# Patient Record
Sex: Male | Born: 1980 | Race: White | Hispanic: No | Marital: Single | Smoking: Current every day smoker
Health system: Southern US, Community
[De-identification: ages and names within clinical notes are randomized; demographics above are authoritative.]

## PROBLEM LIST (undated history)

## (undated) DIAGNOSIS — I10 Essential (primary) hypertension: Secondary | ICD-10-CM

## (undated) DIAGNOSIS — M109 Gout, unspecified: Secondary | ICD-10-CM

## (undated) DIAGNOSIS — K219 Gastro-esophageal reflux disease without esophagitis: Secondary | ICD-10-CM

## (undated) DIAGNOSIS — I214 Non-ST elevation (NSTEMI) myocardial infarction: Secondary | ICD-10-CM

## (undated) DIAGNOSIS — Z720 Tobacco use: Secondary | ICD-10-CM

## (undated) DIAGNOSIS — E785 Hyperlipidemia, unspecified: Secondary | ICD-10-CM

## (undated) HISTORY — DX: Gout, unspecified: M10.9

## (undated) HISTORY — DX: Non-ST elevation (NSTEMI) myocardial infarction: I21.4

---

## 2012-10-21 ENCOUNTER — Observation Stay (HOSPITAL_COMMUNITY): Payer: Medicaid Other

## 2012-10-21 ENCOUNTER — Inpatient Hospital Stay (HOSPITAL_COMMUNITY)
Admission: AD | Admit: 2012-10-21 | Discharge: 2012-10-23 | DRG: 282 | Disposition: A | Discharge status: Home / Self Care | Payer: Medicaid Other | Source: Other Acute Inpatient Hospital | Attending: Cardiology | Admitting: Cardiology

## 2012-10-21 ENCOUNTER — Encounter (HOSPITAL_COMMUNITY): Payer: Self-pay | Admitting: *Deleted

## 2012-10-21 DIAGNOSIS — R110 Nausea: Secondary | ICD-10-CM

## 2012-10-21 DIAGNOSIS — K219 Gastro-esophageal reflux disease without esophagitis: Secondary | ICD-10-CM

## 2012-10-21 DIAGNOSIS — Z720 Tobacco use: Secondary | ICD-10-CM

## 2012-10-21 DIAGNOSIS — R5381 Other malaise: Secondary | ICD-10-CM

## 2012-10-21 DIAGNOSIS — F17200 Nicotine dependence, unspecified, uncomplicated: Secondary | ICD-10-CM | POA: Diagnosis present

## 2012-10-21 DIAGNOSIS — R7989 Other specified abnormal findings of blood chemistry: Secondary | ICD-10-CM

## 2012-10-21 DIAGNOSIS — R51 Headache: Secondary | ICD-10-CM

## 2012-10-21 DIAGNOSIS — R079 Chest pain, unspecified: Secondary | ICD-10-CM

## 2012-10-21 DIAGNOSIS — I214 Non-ST elevation (NSTEMI) myocardial infarction: Secondary | ICD-10-CM

## 2012-10-21 DIAGNOSIS — R519 Headache, unspecified: Secondary | ICD-10-CM

## 2012-10-21 DIAGNOSIS — E7800 Pure hypercholesterolemia, unspecified: Secondary | ICD-10-CM | POA: Diagnosis present

## 2012-10-21 DIAGNOSIS — E785 Hyperlipidemia, unspecified: Secondary | ICD-10-CM

## 2012-10-21 HISTORY — DX: Tobacco use: Z72.0

## 2012-10-21 HISTORY — DX: Hyperlipidemia, unspecified: E78.5

## 2012-10-21 HISTORY — DX: Gastro-esophageal reflux disease without esophagitis: K21.9

## 2012-10-21 LAB — PRO B NATRIURETIC PEPTIDE: Pro B Natriuretic peptide (BNP): 220 pg/mL — ABNORMAL HIGH (ref 0–125)

## 2012-10-21 MED ORDER — PNEUMOCOCCAL VAC POLYVALENT 25 MCG/0.5ML IJ INJ
0.5000 mL | INJECTION | INTRAMUSCULAR | Status: AC
  Filled 2012-10-21: qty 0.5

## 2012-10-21 MED ORDER — ASPIRIN 81 MG PO CHEW
324.0000 mg | CHEWABLE_TABLET | ORAL | Status: DC
  Filled 2012-10-21: qty 4

## 2012-10-21 MED ORDER — SODIUM CHLORIDE 0.9 % IV SOLN: 250.0000 mL | INTRAVENOUS | Status: DC | PRN

## 2012-10-21 MED ORDER — HYDROCODONE-ACETAMINOPHEN 5-325 MG PO TABS
1.0000 | ORAL_TABLET | ORAL | Status: DC | PRN
  Administered 2012-10-21 – 2012-10-22 (×3): 1 via ORAL
  Filled 2012-10-21 (×3): qty 1

## 2012-10-21 MED ORDER — ASPIRIN 300 MG RE SUPP: 300.0000 mg | RECTAL | Status: DC

## 2012-10-21 MED ORDER — ASPIRIN EC 81 MG PO TBEC
81.0000 mg | DELAYED_RELEASE_TABLET | Freq: Every day | ORAL | Status: DC
  Administered 2012-10-23: 11:00:00 81 mg via ORAL
  Filled 2012-10-21 (×2): qty 1

## 2012-10-21 MED ORDER — NITROGLYCERIN 0.4 MG SL SUBL: 0.4000 mg | SUBLINGUAL_TABLET | SUBLINGUAL | Status: DC | PRN

## 2012-10-21 MED ORDER — METOPROLOL TARTRATE 12.5 MG HALF TABLET
12.5000 mg | ORAL_TABLET | Freq: Two times a day (BID) | ORAL | Status: DC
  Filled 2012-10-21: qty 1

## 2012-10-21 MED ORDER — ONDANSETRON HCL 4 MG/2ML IJ SOLN: 4.0000 mg | Freq: Four times a day (QID) | INTRAMUSCULAR | Status: DC | PRN

## 2012-10-21 MED ORDER — ACETAMINOPHEN 325 MG PO TABS: 650.0000 mg | ORAL_TABLET | ORAL | Status: DC | PRN

## 2012-10-21 MED ORDER — SODIUM CHLORIDE 0.9 % IJ SOLN: 3.0000 mL | INTRAMUSCULAR | Status: DC | PRN

## 2012-10-21 MED ORDER — ENOXAPARIN SODIUM 150 MG/ML ~~LOC~~ SOLN
1.0000 mg/kg | Freq: Two times a day (BID) | SUBCUTANEOUS | Status: DC
  Administered 2012-10-21 – 2012-10-22 (×2): 125 mg via SUBCUTANEOUS
  Filled 2012-10-21 (×4): qty 1

## 2012-10-21 MED ORDER — SODIUM CHLORIDE 0.9 % IJ SOLN
3.0000 mL | Freq: Two times a day (BID) | INTRAMUSCULAR | Status: DC
  Administered 2012-10-21: 3 mL via INTRAVENOUS

## 2012-10-21 MED ORDER — INFLUENZA VIRUS VACC SPLIT PF IM SUSP
0.5000 mL | INTRAMUSCULAR | Status: AC
  Filled 2012-10-21: qty 0.5

## 2012-10-21 MED ORDER — PANTOPRAZOLE SODIUM 40 MG PO TBEC
40.0000 mg | DELAYED_RELEASE_TABLET | Freq: Every day | ORAL | Status: DC
  Administered 2012-10-21 – 2012-10-23 (×3): 40 mg via ORAL
  Filled 2012-10-21 (×3): qty 1

## 2012-10-21 NOTE — Progress Notes (Signed)
ANTICOAGULATION CONSULT NOTE - Initial Consult  Pharmacy Consult for Lovenox Indication: chest pain/ACS  Allergies  Allergen Reactions  . Sulfur     Patient Measurements: Height: 6' (182.9 cm) Weight: 270 lb 4.5 oz (122.6 kg) IBW/kg (Calculated) : 77.6   Vital Signs: Temp: 98.6 F (37 C) (12/15 1659) Temp src: Oral (12/15 1659) BP: 145/84 mmHg (12/15 1629) Pulse Rate: 78  (12/15 1659)  Labs: No results found for this basename: HGB:2,HCT:3,PLT:3,APTT:3,LABPROT:3,INR:3,HEPARINUNFRC:3,CREATININE:3,CKTOTAL:3,CKMB:3,TROPONINI:3 in the last 72 hours  CrCl is unknown because no creatinine reading has been taken.   Medical History: Past Medical History  Diagnosis Date  . GERD (gastroesophageal reflux disease)    Assessment: Brian Deleon is a 31 year old male transferred from [facility] for cp/acs and positive troponin to begin lovenox per pharmacy. Noted patient's labs draw at [facility] show hgb of 16.2 and platelets of 201. Renal function ok with scr of 0.91. Baseline INR 1.   Patient was on sq Heparin at [facility] and last dose was given today at 12:39.   Goal of Therapy:  Anti-Xa level 0.6-1.2 units/ml 4hrs after LMWH dose given Monitor platelets by anticoagulation protocol: Yes   Plan:  Begin lovenox 1mg /kg sq q12h Q3d CBC while on lovenox  Thank you,  Brett Fairy, PharmD, BCPS 10/21/2012 5:51 PM

## 2012-10-21 NOTE — Progress Notes (Signed)
10/21/2012 6:24 PM Nursing note Paged PA Ward Givens for travel information for ordered CXR. VSS, pt. Pain free at this time however troponin at [facility] was positive. Verbal orders received ok to change ordered CXR to portable. Orders enacted. Will continue to closely monitor patient.  Brian Deleon, Blanchard Kelch

## 2012-10-21 NOTE — Progress Notes (Signed)
CRITICAL VALUE ALERT  Critical value received:  Troponin 1.94 Date of notification:  10/21/2012   Time of notification:  1850  Critical value read back yes  Nurse who received alert: Shirlee More  MD notified (1st page):  Ward Givens NP  Time of first page:  1851   MD notified (2nd page):  Time of second page:  Responding MD:  Ward Givens NP  Time MD responded:  [phone]

## 2012-10-21 NOTE — H&P (Signed)
Physician Discharge Summary  Patient ID: Codi Belarus MRN: [id number] DOB/AGE: 02-26-1981 31 y.o. Admit date: 10/21/2012  Primary Care Physician:DEFAULT,PROVIDER, MD Primary Cardiologist: New-Mikiah Demond Primary Physician: Does not have one; Seen at [facility] ED Principal Problem:  *Abnormal laboratory test Active Problems:  Chest pain at rest  Nausea  Headache  HPI: This patient is a 31 year old obese male patient with no prior cardiac history who was in his usual state of health until approximately one week ago. The patient states he started "feeling bad" with vague symptoms of chest discomfort, abdominal pain, and fatigue. He began have some episodes of diaphoresis which would come and go with some pressure in his chest. He denied any coughing, chest congestion, or dyspnea on exertion. Symptoms became progressive, with some pain becoming more severe in a crescendo decrescendo type pattern with radiation to the back of his neck and his head. He states he has had a headache for several days as well. Because of his ongoing symptoms and recurrence of chest discomfort he was seen at [facility] emergency room.          Labs were completed which revealed a positive troponin at 0.68. He was also found to be positive for marijuana in urine drug screen. D-dimer was negative. Amylase and lipase were within normal limits along with his liver enzymes. He was not found to be anemic, hematocrit was 48.2 with a hemoglobin of 16.2. Creatinine was 0.91. Sodium was 137 with a potassium of 4.3. The patient was given aspirin 81 mg, nitroglycerin 1 inch to chest wall, and started on heparin injection 5000 units subcutaneous. Dr. Patty Sermons was notified by phone from [facility] ER the patient had positive cardiac enzyme. EKG revealed normal sinus rhythm without acute ACS. Dr. Patty Sermons accepted this patient on transfer from [facility] rate is now currently admitted to the telemetry unit. He continues to have complaints of chest  discomfort although very slight, main complaint now is low-grade nausea with the headache. The patient has no prior medical history, he does not have a primary care physician. He states his daughter has been sick with pneumonia, he felt that he may be catching her illness causing his symptoms, but as it progressed he became more concerned prompting his ER evaluation in [place].  Review of systems complete and found to be negative unless listed above  Past Medical History  Diagnosis Date  . GERD (gastroesophageal reflux disease)     Family History  Problem Relation Age of Onset  . Heart attack Father   . Hypertension Father     History   Social History  . Marital Status: Single    Spouse Name: N/A    Number of Children: N/A  . Years of Education: N/A   Occupational History  . Mechanic    Social History Main Topics  . Smoking status: Current Every Day Smoker -- 2.0 packs/day for 15 years  . Smokeless tobacco: Not on file  . Alcohol Use: No  . Drug Use: No  . Sexually Active: Yes   Other Topics Concern  . Not on file   Social History Narrative   Works as a Curator    History reviewed. No pertinent past surgical history.   No prescriptions prior to admission    Physical Exam: Blood pressure 145/84, pulse 78, temperature 98.6 F (37 C), temperature source Oral, resp. rate 18, height 6' (1.829 m), weight 270 lb 4.5 oz (122.6 kg), SpO2 99.00%.   General: Well developed, well nourished, in no acute distress  Head: Eyes PERRLA, No xanthomas.   Normal cephalic and atramatic  Lungs: Clear bilaterally to auscultation and percussion. Heart: HRRR S1 S2, without MRG.  Pulses are 2+ & equal.            No carotid bruit. No JVD.  No abdominal bruits. No femoral bruits. Abdomen: Bowel sounds are positive, abdomen soft and non-tender without masses or                  Hernia's noted. Mild discomfort on palpation of mid epigastrium.  Msk:  Back normal, normal gait. Normal strength and  tone for age. Extremities: No clubbing, cyanosis or edema.  DP +1 Neuro: Alert and oriented X 3. Psych:  Good affect, responds appropriately   Labs:  Please see labs which accompanied patient from [facility]. AM labs are ordered with serial cardiac enzymes.  Radiology:[place]  EKG:NSR rate of 74 bpm.  ASSESSMENT AND PLAN:   1. Positive Troponin: Uncertain etiology at this time. The patient has been having intermittent chest discomfort crescendo decrescendo in his description, which has worsened over the last 24-48 hours. However he has been having some discomfort in his chest on and off for approximately one week with some low-grade nausea and headache. On presentation to [facility] emergency room the patient did have positive cardiac enzyme of 0.68 on his first set. We will continue cycle cardiac enzymes every 6 hours per protocol. The patient's EKG was negative for ACS.     The patient will be kept n.p.o. after midnight for possible stress cardiolite in the a.m. unless cardiac enzymes have been found positive at which point cardiac catheterization will be considered. In he interim we will complete risk stratification to evaluate cholesterol status as he has a strong family history of CAD in his father. Continue him on aspirin 81 mg daily, hold off on low-dose beta blocker at this time. Will have EKG completed in the a.m. Chest X-ray today.  2. Nausea: Review of labs reveals negative amylase. Would check H. pylori, place him on proton pump inhibitor, Protonix 40 mg daily. He is given when necessary anti-emetics. He is a negative Murphy sign but he does have some epigastric tenderness on palpation.  3. Tobacco abuse: Smoking cessation is strongly recommended. He will be given smoking cessation instructions was consultation with smoking cessation RN in a.m. This is a very strong risk factor for CAD, this has been explained to him.CXR today.     Signed: Joni Reining 10/21/2012, 5:05  PM Co-Sign MD  Agree with assessment and plan as noted above. Patient's symptoms are very atypical for ischemic chest pain but outside troponin slightly high at [facility] ER. Chest pain was not pleuritic and has been improving during the day today. He has had nonspecific symptoms of malaise and nausea for the past week. His EKG does not show any evidence of pericarditis. Chest xray is pending. Physical exam is unremarkable. Plan will be to cycle enzymes overnight and if negative perform treadmill Myoview tomorrow, along with 2D echo. Possible home Monday pm if studies negative. Smokes up to 2 ppd---cessation discussed and encouraged.

## 2012-10-22 ENCOUNTER — Encounter (HOSPITAL_COMMUNITY)
Admission: AD | Disposition: A | Discharge status: Home / Self Care | Payer: Self-pay | Source: Other Acute Inpatient Hospital | Attending: Cardiology

## 2012-10-22 DIAGNOSIS — I214 Non-ST elevation (NSTEMI) myocardial infarction: Secondary | ICD-10-CM

## 2012-10-22 HISTORY — PX: CARDIAC CATHETERIZATION: SHX172

## 2012-10-22 HISTORY — PX: LEFT HEART CATHETERIZATION WITH CORONARY ANGIOGRAM: SHX5451

## 2012-10-22 LAB — LIPID PANEL
LDL Cholesterol: 118 mg/dL — ABNORMAL HIGH (ref 0–99)
Total CHOL/HDL Ratio: 6.3 RATIO
Triglycerides: 154 mg/dL — ABNORMAL HIGH (ref <150)
VLDL: 31 mg/dL (ref 0–40)

## 2012-10-22 LAB — BASIC METABOLIC PANEL
Calcium: 9.4 mg/dL (ref 8.4–10.5)
GFR calc Af Amer: >90 mL/min (ref >90)
GFR calc non Af Amer: >90 mL/min (ref >90)
Potassium: 3.9 mEq/L (ref 3.5–5.1)
Sodium: 140 mEq/L (ref 135–145)

## 2012-10-22 LAB — CBC
HCT: 47 % (ref 39.0–52.0)
Hemoglobin: 16.4 g/dL (ref 13.0–17.0)
MCV: 90.9 fL (ref 78.0–100.0)
RBC: 5.17 MIL/uL (ref 4.22–5.81)
WBC: 6.1 10*3/uL (ref 4.0–10.5)

## 2012-10-22 LAB — PROTIME-INR: INR: 1.04 (ref 0.00–1.49)

## 2012-10-22 LAB — TROPONIN I: Troponin I: 0.77 ng/mL (ref <0.30)

## 2012-10-22 LAB — TSH: TSH: 0.841 u[IU]/mL (ref 0.350–4.500)

## 2012-10-22 SURGERY — LEFT HEART CATHETERIZATION WITH CORONARY ANGIOGRAM
Anesthesia: LOCAL

## 2012-10-22 MED ORDER — MIDAZOLAM HCL 2 MG/2ML IJ SOLN
INTRAMUSCULAR | Status: AC
  Filled 2012-10-22: qty 2

## 2012-10-22 MED ORDER — VERAPAMIL HCL 2.5 MG/ML IV SOLN
INTRAVENOUS | Status: AC
  Filled 2012-10-22: qty 2

## 2012-10-22 MED ORDER — FENTANYL CITRATE 0.05 MG/ML IJ SOLN
INTRAMUSCULAR | Status: AC
  Filled 2012-10-22: qty 2

## 2012-10-22 MED ORDER — DIAZEPAM 5 MG PO TABS
5.0000 mg | ORAL_TABLET | ORAL | Status: AC
  Administered 2012-10-22: 5 mg via ORAL
  Filled 2012-10-22: qty 1

## 2012-10-22 MED ORDER — ZOLPIDEM TARTRATE 5 MG PO TABS
5.0000 mg | ORAL_TABLET | Freq: Every evening | ORAL | Status: DC | PRN
  Administered 2012-10-22: 5 mg via ORAL
  Filled 2012-10-22: qty 1

## 2012-10-22 MED ORDER — ASPIRIN 81 MG PO CHEW
324.0000 mg | CHEWABLE_TABLET | ORAL | Status: AC
  Administered 2012-10-22: 324 mg via ORAL

## 2012-10-22 MED ORDER — SODIUM CHLORIDE 0.9 % IV SOLN
INTRAVENOUS | Status: AC
  Administered 2012-10-22: 18:00:00 via INTRAVENOUS

## 2012-10-22 MED ORDER — SODIUM CHLORIDE 0.9 % IJ SOLN: 3.0000 mL | INTRAMUSCULAR | Status: DC | PRN

## 2012-10-22 MED ORDER — ATORVASTATIN CALCIUM 40 MG PO TABS
40.0000 mg | ORAL_TABLET | Freq: Every day | ORAL | Status: DC
  Administered 2012-10-22: 40 mg via ORAL
  Filled 2012-10-22 (×3): qty 1

## 2012-10-22 MED ORDER — METOPROLOL TARTRATE 12.5 MG HALF TABLET
12.5000 mg | ORAL_TABLET | Freq: Two times a day (BID) | ORAL | Status: DC
  Administered 2012-10-22 – 2012-10-23 (×3): 12.5 mg via ORAL
  Filled 2012-10-22 (×5): qty 1

## 2012-10-22 MED ORDER — HEPARIN SODIUM (PORCINE) 1000 UNIT/ML IJ SOLN
INTRAMUSCULAR | Status: AC
  Filled 2012-10-22: qty 1

## 2012-10-22 MED ORDER — SODIUM CHLORIDE 0.9 % IJ SOLN
3.0000 mL | Freq: Two times a day (BID) | INTRAMUSCULAR | Status: DC
  Administered 2012-10-22: 3 mL via INTRAVENOUS

## 2012-10-22 MED ORDER — SODIUM CHLORIDE 0.9 % IV SOLN
1.0000 mL/kg/h | INTRAVENOUS | Status: DC
  Administered 2012-10-22: 1 mL/kg/h via INTRAVENOUS

## 2012-10-22 MED ORDER — SODIUM CHLORIDE 0.9 % IV SOLN: 250.0000 mL | INTRAVENOUS | Status: DC | PRN

## 2012-10-22 NOTE — Progress Notes (Signed)
TR BAND REMOVAL  LOCATION:  right radial  DEFLATED PER PROTOCOL:  yes  TIME BAND OFF / DRESSING APPLIED:   1900   SITE UPON ARRIVAL:   Level 0  SITE AFTER BAND REMOVAL:  Level 0  REVERSE ALLEN'S TEST:    positive  CIRCULATION SENSATION AND MOVEMENT:  Within Normal Limits  yes  COMMENTS:    

## 2012-10-22 NOTE — Plan of Care (Signed)
Problem: Phase II Progression Outcomes Goal: Other Phase II Outcomes/Goals Outcome: Completed/Met Date Met:  10/22/12 Cath video viewed

## 2012-10-22 NOTE — CV Procedure (Signed)
   Cardiac Catheterization Procedure Note  Name: Brian Deleon MRN: [id number] DOB: 07/14/1981  Procedure: Left Heart Cath, Selective Coronary Angiography, LV angiography  Indication: Non STEMI   Procedural Details: The right wrist was prepped, draped, and anesthetized with 1% lidocaine. Using the modified Seldinger technique, a 5 French sheath was introduced into the right radial artery. 3 mg of verapamil was administered through the sheath, weight-based unfractionated heparin was administered intravenously. Standard Judkins catheters were used for selective coronary angiography and left ventriculography. Catheter exchanges were performed over an exchange length guidewire. There were no immediate procedural complications. A TR band was used for radial hemostasis at the completion of the procedure.  The patient was transferred to the post catheterization recovery area for further monitoring.  Procedural Findings: Hemodynamics: AO 106/74 (92) LV 115/18 No gradient on pullback  Coronary angiography: Coronary dominance: right  Left mainstem: Normal with no obstruction  Left anterior descending (LAD): Courses to the apex and provides the apical tip.  Smooth throughout.  No evidence of significant obstruction  Left circumflex (LCx): Provides a smaller ramus and large marginal branch.  No significant obstruction.   Right coronary artery (RCA): Provides a PDA and PLA, both moderate in size.  No significant obstruction is noted.    Left ventriculography: Left ventricular systolic function is normal, LVEF is estimated at 55-65%, there is no significant mitral regurgitation.  There might be a tiny area of inferobasal hypokinesis, but this is not definitive.     Final Conclusions:   1.  Non STEMI by enzyme analysis 2.  No evidence of high grade obstruction.    Recommendations:  1.  DC smoking 2.  Medical therapy.   Shawnie Pons  MD, [facility], FSCAI 10/22/2012, 4:48 PM

## 2012-10-22 NOTE — H&P (View-Only) (Signed)
 Subjective:  The patient has had no further chest discomfort overnight.  He does complain of a mild headache.  His heart rhythm has been normal.  His chest x-ray is normal.  His troponins since arrival at this hospital have been elevated and are trending down.  EKG today still shows no ischemic change and no evidence of pericarditis.  Objective:  Vital Signs in the last 24 hours: Temp:  [98 F (36.7 C)-98.6 F (37 C)] 98.2 F (36.8 C) (12/16 0540) Pulse Rate:  [64-78] 64  (12/16 0540) Resp:  [18-20] 18  (12/16 0540) BP: (114-145)/(73-84) 114/73 mmHg (12/16 0540) SpO2:  [97 %-99 %] 97 % (12/16 0540) Weight:  [270 lb 4.5 oz (122.6 kg)] 270 lb 4.5 oz (122.6 kg) (12/15 1659)  Intake/Output from previous day:   Intake/Output from this shift:       . aspirin  324 mg Oral NOW   Or  . aspirin  300 mg Rectal NOW  . aspirin EC  81 mg Oral Daily  . atorvastatin  40 mg Oral q1800  . influenza  inactive virus vaccine  0.5 mL Intramuscular Tomorrow-1000  . metoprolol tartrate  12.5 mg Oral BID  . pantoprazole  40 mg Oral Daily  . pneumococcal 23 valent vaccine  0.5 mL Intramuscular Tomorrow-1000  . sodium chloride  3 mL Intravenous Q12H      Physical Exam: The patient appears to be in no distress.  Head and neck exam reveals that the pupils are equal and reactive.  The extraocular movements are full.  There is no scleral icterus.  Mouth and pharynx are benign.  No lymphadenopathy.  No carotid bruits.  The jugular venous pressure is normal.  Thyroid is not enlarged or tender.  Chest is clear to percussion and auscultation.  No rales or rhonchi.  Expansion of the chest is symmetrical.  Heart reveals no abnormal lift or heave.  First and second heart sounds are normal.  There is no murmur gallop rub or click.  The abdomen is soft and nontender.  Bowel sounds are normoactive.  There is no hepatosplenomegaly or mass.  There are no abdominal bruits.  Extremities reveal no phlebitis or  edema.  Pedal pulses are good.  There is no cyanosis or clubbing.  Neurologic exam is normal strength and no lateralizing weakness.  No sensory deficits.  Integument reveals no rash  Lab Results: No results found for this basename: WBC:2,HGB:2,PLT:2 in the last 72 hours  Basename 10/22/12 0440  NA 140  K 3.9  CL 103  CO2 26  GLUCOSE 98  BUN 9  CREATININE 1.00    Basename 10/22/12 0440 10/21/12 2351  TROPONINI 0.77* 1.02*   Hepatic Function Panel No results found for this basename: PROT,ALBUMIN,AST,ALT,ALKPHOS,BILITOT,BILIDIR,IBILI in the last 72 hours  Basename 10/22/12 0440  CHOL 177   No results found for this basename: PROTIME in the last 72 hours  Imaging: Dg Chest Port 1 View  10/21/2012  *RADIOLOGY REPORT*  Clinical Data: Chest pain and shortness of breath.  PORTABLE CHEST - 1 VIEW  Comparison: 10/21/2012 at 11:39 a.m.  Findings: Lungs are adequately inflated without consolidation or effusion.  The cardiomediastinal silhouette and remainder of the exam is unchanged.  IMPRESSION: No acute cardiopulmonary disease.   Original Report Authenticated By: Daniel Boyle, M.D.     Cardiac Studies: EKG today shows no ischemic change.  Telemetry is normal Assessment/Plan:  1. chest pain      Assessment: Non-STEMI with mild enzyme elevation   of troponin and no EKG changes.  Troponins are 1.94, 1.02, and 0.77      Plan: We'll plan left heart cardiac catheterization today.  Since he is not having stress test we will add beta blocker at this time and use IV heparin. 2.  Hypercholesterolemia      Assessment: LDL 118, HDL 28      Plan: Add Lipitor 40 mg daily 3.  tobacco abuse      Assessment: Smokes 1-2 packs of cigarettes a day       Plan: Smoking cessation strongly advised  LOS: 1 day    Kyira Volkert 10/22/2012, 7:58 AM    

## 2012-10-22 NOTE — Progress Notes (Signed)
Subjective:  The patient has had no further chest discomfort overnight.  He does complain of a mild headache.  His heart rhythm has been normal.  His chest x-ray is normal.  His troponins since arrival at this hospital have been elevated and are trending down.  EKG today still shows no ischemic change and no evidence of pericarditis.  Objective:  Vital Signs in the last 24 hours: Temp:  [98 F (36.7 C)-98.6 F (37 C)] 98.2 F (36.8 C) (12/16 0540) Pulse Rate:  [64-78] 64  (12/16 0540) Resp:  [18-20] 18  (12/16 0540) BP: (114-145)/(73-84) 114/73 mmHg (12/16 0540) SpO2:  [97 %-99 %] 97 % (12/16 0540) Weight:  [270 lb 4.5 oz (122.6 kg)] 270 lb 4.5 oz (122.6 kg) (12/15 1659)  Intake/Output from previous day:   Intake/Output from this shift:       . aspirin  324 mg Oral NOW   Or  . aspirin  300 mg Rectal NOW  . aspirin EC  81 mg Oral Daily  . atorvastatin  40 mg Oral q1800  . influenza  inactive virus vaccine  0.5 mL Intramuscular Tomorrow-1000  . metoprolol tartrate  12.5 mg Oral BID  . pantoprazole  40 mg Oral Daily  . pneumococcal 23 valent vaccine  0.5 mL Intramuscular Tomorrow-1000  . sodium chloride  3 mL Intravenous Q12H      Physical Exam: The patient appears to be in no distress.  Head and neck exam reveals that the pupils are equal and reactive.  The extraocular movements are full.  There is no scleral icterus.  Mouth and pharynx are benign.  No lymphadenopathy.  No carotid bruits.  The jugular venous pressure is normal.  Thyroid is not enlarged or tender.  Chest is clear to percussion and auscultation.  No rales or rhonchi.  Expansion of the chest is symmetrical.  Heart reveals no abnormal lift or heave.  First and second heart sounds are normal.  There is no murmur gallop rub or click.  The abdomen is soft and nontender.  Bowel sounds are normoactive.  There is no hepatosplenomegaly or mass.  There are no abdominal bruits.  Extremities reveal no phlebitis or  edema.  Pedal pulses are good.  There is no cyanosis or clubbing.  Neurologic exam is normal strength and no lateralizing weakness.  No sensory deficits.  Integument reveals no rash  Lab Results: No results found for this basename: WBC:2,HGB:2,PLT:2 in the last 72 hours  Basename 10/22/12 0440  NA 140  K 3.9  CL 103  CO2 26  GLUCOSE 98  BUN 9  CREATININE 1.00    Basename 10/22/12 0440 10/21/12 2351  TROPONINI 0.77* 1.02*   Hepatic Function Panel No results found for this basename: PROT,ALBUMIN,AST,ALT,ALKPHOS,BILITOT,BILIDIR,IBILI in the last 72 hours  Basename 10/22/12 0440  CHOL 177   No results found for this basename: PROTIME in the last 72 hours  Imaging: Dg Chest Port 1 View  10/21/2012  *RADIOLOGY REPORT*  Clinical Data: Chest pain and shortness of breath.  PORTABLE CHEST - 1 VIEW  Comparison: 10/21/2012 at 11:39 a.m.  Findings: Lungs are adequately inflated without consolidation or effusion.  The cardiomediastinal silhouette and remainder of the exam is unchanged.  IMPRESSION: No acute cardiopulmonary disease.   Original Report Authenticated By: Elberta Fortis, M.D.     Cardiac Studies: EKG today shows no ischemic change.  Telemetry is normal Assessment/Plan:  1. chest pain      Assessment: Non-STEMI with mild enzyme elevation  of troponin and no EKG changes.  Troponins are 1.94, 1.02, and 0.77      Plan: We'll plan left heart cardiac catheterization today.  Since he is not having stress test we will add beta blocker at this time and use IV heparin. 2.  Hypercholesterolemia      Assessment: LDL 118, HDL 28      Plan: Add Lipitor 40 mg daily 3.  tobacco abuse      Assessment: Smokes 1-2 packs of cigarettes a day       Plan: Smoking cessation strongly advised  LOS: 1 day    Cassell Clement 10/22/2012, 7:58 AM

## 2012-10-22 NOTE — Progress Notes (Signed)
ANTICOAGULATION CONSULT NOTE - Initial Consult  Pharmacy Consult for heparin Indication: chest pain/ACS  Allergies  Allergen Reactions  . Sulfur     Patient Measurements: Height: 6' (182.9 cm) Weight: 270 lb 4.5 oz (122.6 kg) IBW/kg (Calculated) : 77.6   Vital Signs: Temp: 98.2 F (36.8 C) (12/16 0540) Temp src: Oral (12/16 0540) BP: 114/73 mmHg (12/16 0540) Pulse Rate: 64  (12/16 0540)  Labs:  Basename 10/22/12 0440 10/21/12 2351 10/21/12 1729  HGB -- -- --  HCT -- -- --  PLT -- -- --  APTT -- -- --  LABPROT -- -- --  INR -- -- --  HEPARINUNFRC -- -- --  CREATININE 1.00 -- --  CKTOTAL -- -- --  CKMB -- -- --  TROPONINI 0.77* 1.02* 1.94*    Estimated Creatinine Clearance: 143.4 ml/min (by C-G formula based on Cr of 1).   Medical History: Past Medical History  Diagnosis Date  . GERD (gastroesophageal reflux disease)    Assessment: 31 yom transferred from [facility] due to CP. He was initially started on full-dose lovenox. He received his last dose approximate 1.5 hours ago this AM. His next lovenox dose would have been due around 1900 tonight. Now transitioning to heparin. Pt is currently fully anticoagulated. The earliest we could start heparin would be after 75% of the lovenox dosing interval (~1600 this afternoon). Noted plans for cardiac cath today.   Goal of Therapy:  Heparin level 0.3-0.7 units/ml Monitor platelets by anticoagulation protocol: Yes   Plan:  1. F/u plans for cardiac cath - will initiate heparin IV if cath is late today or if delayed. Pt is fully anticoagulated now.   Brian Deleon, Brian Deleon 10/22/2012,8:22 AM

## 2012-10-22 NOTE — Interval H&P Note (Signed)
History and Physical Interval Note:  10/22/2012 3:53 PM  Brian Deleon  has presented today for surgery, with the diagnosis of cp  The various methods of treatment have been discussed with the patient. After consideration of risks, benefits and other options for treatment, the patient has consented to  Procedure(s) (LRB) with comments: LEFT HEART CATHETERIZATION WITH CORONARY ANGIOGRAM (N/A) as a surgical intervention .  The patient's history has been reviewed, patient examined, no change in status, stable for surgery.  I have reviewed the patient's chart and labs.  Questions were answered to the patient's satisfaction.     Shawnie Pons

## 2012-10-23 ENCOUNTER — Encounter (HOSPITAL_COMMUNITY): Payer: Self-pay | Admitting: Physician Assistant

## 2012-10-23 DIAGNOSIS — E785 Hyperlipidemia, unspecified: Secondary | ICD-10-CM

## 2012-10-23 DIAGNOSIS — I517 Cardiomegaly: Secondary | ICD-10-CM

## 2012-10-23 DIAGNOSIS — K219 Gastro-esophageal reflux disease without esophagitis: Secondary | ICD-10-CM

## 2012-10-23 DIAGNOSIS — R5381 Other malaise: Secondary | ICD-10-CM

## 2012-10-23 DIAGNOSIS — I214 Non-ST elevation (NSTEMI) myocardial infarction: Secondary | ICD-10-CM

## 2012-10-23 DIAGNOSIS — Z720 Tobacco use: Secondary | ICD-10-CM

## 2012-10-23 DIAGNOSIS — R5383 Other fatigue: Secondary | ICD-10-CM

## 2012-10-23 LAB — CBC
Hemoglobin: 14.7 g/dL (ref 13.0–17.0)
MCH: 31.2 pg (ref 26.0–34.0)
RBC: 4.71 MIL/uL (ref 4.22–5.81)

## 2012-10-23 LAB — BASIC METABOLIC PANEL
CO2: 27 mEq/L (ref 19–32)
Glucose, Bld: 114 mg/dL — ABNORMAL HIGH (ref 70–99)
Potassium: 4.3 mEq/L (ref 3.5–5.1)
Sodium: 141 mEq/L (ref 135–145)

## 2012-10-23 MED ORDER — ATORVASTATIN CALCIUM 40 MG PO TABS: 40.0000 mg | ORAL_TABLET | Freq: Every day | ORAL | Status: DC

## 2012-10-23 MED ORDER — NITROGLYCERIN 0.4 MG SL SUBL: 0.4000 mg | SUBLINGUAL_TABLET | SUBLINGUAL | Status: DC | PRN

## 2012-10-23 MED ORDER — PANTOPRAZOLE SODIUM 40 MG PO TBEC: 40.0000 mg | DELAYED_RELEASE_TABLET | Freq: Every day | ORAL | Status: DC

## 2012-10-23 MED ORDER — METOPROLOL TARTRATE 12.5 MG HALF TABLET: 12.5000 mg | ORAL_TABLET | Freq: Two times a day (BID) | ORAL | Status: DC

## 2012-10-23 MED ORDER — ASPIRIN 81 MG PO TBEC: 81.0000 mg | DELAYED_RELEASE_TABLET | Freq: Every day | ORAL | Status: AC

## 2012-10-23 NOTE — Discharge Summary (Signed)
Discharge Summary   Patient ID: Brian Deleon,  MRN: [id number], DOB/AGE: Sep 03, 1981 31 y.o.  Admit date: 10/21/2012 Discharge date: 10/23/2012  Primary Physician: Sheila Oats, MD Primary Cardiologist: New to cardiology- seen by Dr. Patty Sermons in consultation  Discharge Diagnoses Principal Problem:  *NSTEMI (non-ST elevated myocardial infarction)  - s/p cardiac cath 10/22/12: angiographically normal coronaries; LVEF 55-65%; questionable inferobasal HK  - suspected 2/2 transient coronary vasospasm induced by stress and smoking tobacco Active Problems:  Nausea  Headache  Hyperlipidemia  - newly diagnosed, started on atorvastatin   GERD (gastroesophageal reflux disease)  - discharged on PPI  Tobacco abuse  - smoking cessation stressed, advised to establish with PCP  Malaise and fatigue   Allergies Allergies  Allergen Reactions  . Sulfur Other (See Comments)    Reaction unknown    Diagnostic Studies/Procedures  PORTABLE CHEST X-RAY - 10/21/12  Comparison: 10/21/2012 at 11:39 a.m.  Findings: Lungs are adequately inflated without consolidation or  effusion. The cardiomediastinal silhouette and remainder of the  exam is unchanged.  IMPRESSION:  No acute cardiopulmonary disease.  CARDIAC CATHETERIZATION 10/22/12  Hemodynamics:  AO 106/74 (92)  LV 115/18  No gradient on pullback  Coronary angiography:  Coronary dominance: right  Left mainstem: Normal with no obstruction  Left anterior descending (LAD): Courses to the apex and provides the apical tip. Smooth throughout. No evidence of significant obstruction  Left circumflex (LCx): Provides a smaller ramus and large marginal branch. No significant obstruction.  Right coronary artery (RCA): Provides a PDA and PLA, both moderate in size. No significant obstruction is noted.  Left ventriculography: Left ventricular systolic function is normal, LVEF is estimated at 55-65%, there is no significant mitral regurgitation.  There might be a tiny area of inferobasal hypokinesis, but this is not definitive.  Final Conclusions:  1. Non STEMI by enzyme analysis  2. No evidence of high grade obstruction.   TRANSTHORACIC ECHOCARDIOGRAM - 10/23/12  Study Conclusions  Left ventricle: The cavity size was mildly dilated. Wall thickness was normal. Systolic function was normal. The estimated ejection fraction was in the range of 55% to 60%.  History of Present Illness  [name] is a 31yo male who was transferred from [place] to [facility] hospital on 10/21/12 with the above problem list. He has no prior cardiac history. Family history of CAD in his father, tobacco abuse, hyperlipidemia (discovered this admission) and obesity who presented to [facility] ED on 10/21/12 c/o a one-week history of vague intermittent chest discomfort, abdominal pain, nausea, headache and malaise. Chest pain was associated with diaphoresis and radiated into his head and neck. In the ED, initial trop-I returned elevated at 0.68.  Amylase, lipase, LFTs WNL. CBC and BMET unremarkable. EKG without acute ischemic changes. Given elevated trop-I and cardiac RFs, he was accepted on transfer for further evaluation and [facility] hospital.   [facility] Course   He was heparinized, and arrived chest pain-free. Portable CXR as above revealed no acute process. Serial troponins returned mildly elevated, and began to down-trend. EKGs were without ischemic changes. D-dimer WNL. TSH WNL. A lipid panel was ordered, and did reveal poorly controlled LDL. The decision was made to pursue diagnostic cardiac catheterization. He was informed, consented and prepped for the procedure which is outlined above. This revealed angiographically normal coronary arteries, LVEF 55-65%. The recommendation was made to stop smoking and continue medical management. He tolerated the procedure well without complications. He remained stable overnight without acute complaints. He underwent TTE as  above revealing mild  LV dilatation, otherwise preserved EF. He was evaluated by Dr. Patty Sermons and deemed stable for discharge. NSTEMI was suspected to be secondary to transient coronary vasospasm from increased stress and tobacco abuse. He will follow-up within 7 days given NSTEMI status as noted below. He will be discharged on ASA, BB, statin, NTG SL PRN (should vasospasm be the underlying mechanism). He will be started on a PPI for GERD. He will follow-up with his PCP in 1-2 weeks for further assistance regarding tobacco cessation (expressed interest in Chantix) and constitutional symptoms. There was no evidence of infectious etiology this admission. This information, including post-cath instructions, has been clearly outlined in the discharge AVS.   Discharge Vitals:  Blood pressure 129/76, pulse 77, temperature 98 F (36.7 C), temperature source Oral, resp. rate 16, height 6' (1.829 m), weight 121.9 kg (268 lb 11.9 oz), SpO2 98.00%.   Labs: Recent Labs  Basename 10/23/12 0425 10/22/12 0958   WBC 6.5 6.1   HGB 14.7 16.4   HCT 42.9 47.0   MCV 91.1 90.9   PLT 223 225   Recent Labs  Basename 10/23/12 0425   DDIMER 0.31    Lab 10/23/12 0425 10/22/12 0440  NA 141 140  K 4.3 3.9  CL 104 103  CO2 27 26  BUN 12 9  CREATININE 1.11 1.00  CALCIUM 9.1 9.4  PROT -- --  BILITOT -- --  ALKPHOS -- --  ALT -- --  AST -- --  AMYLASE -- --  LIPASE -- --  GLUCOSE 114* 98   Recent Labs  Basename 10/22/12 0440 10/21/12 2351 10/21/12 1729   CKTOTAL -- -- --   CKMB -- -- --   CKMBINDEX -- -- --   TROPONINI 0.77* 1.02* 1.94*   Recent Labs  Basename 10/22/12 0440   CHOL 177   HDL 28*   LDLCALC 118*   TRIG 154*   CHOLHDL 6.3   LDLDIRECT --    Basename 10/21/12 1729  TSH 0.841  T4TOTAL --  T3FREE --  THYROIDAB --    Disposition:  Discharge Orders    Future Appointments: Provider: Department: Dept Phone: Center:   10/29/2012 1:40 PM Prescott Parma, PA [address] (near  [place]) [phone] [facility]     Future Orders Please Complete By Expires   Diet - low sodium heart healthy      Increase activity slowly      Discharge instructions      Scheduling Instructions:   Activity: Increase activity slowly as tolerated. You may shower, but no soaking baths for 1 week. No driving for 2 days. No lifting over 5 lbs for 1 week. No sexual activity for 1 week.   You May Return to Work: in 1 week (if applicable)  Wound Care: You may wash cath site gently with soap and water. Keep cath site clean and dry. If you notice pain, swelling, bleeding or pus at your cath site, please call [phone].     Follow-up Information    Follow up with SERPE, EUGENE, PA. On 10/29/2012. (At 1:45 PM for follow-up after this hospitalization. )    Contact information:   [address], [address] [place] [address] [phone]          Discharge Medications:    Medication List     As of 10/23/2012  1:50 PM    START taking these medications         aspirin 81 MG EC tablet   Take 1 tablet (  81 mg total) by mouth daily.      atorvastatin 40 MG tablet   Commonly known as: LIPITOR   Take 1 tablet (40 mg total) by mouth daily at 6 PM.      metoprolol tartrate 12.5 mg Tabs   Commonly known as: LOPRESSOR   Take 0.5 tablets (12.5 mg total) by mouth 2 (two) times daily.      nitroGLYCERIN 0.4 MG SL tablet   Commonly known as: NITROSTAT   Place 1 tablet (0.4 mg total) under the tongue every 5 (five) minutes as needed for chest pain.      pantoprazole 40 MG tablet   Commonly known as: PROTONIX   Take 1 tablet (40 mg total) by mouth daily.          Where to get your medications    These are the prescriptions that you need to pick up. We sent them to a specific pharmacy, so you will need to go there to get them.   [id number] - [place], [place] - [address]    [address] [place] [address]    Phone: [phone]         atorvastatin 40 MG tablet   metoprolol tartrate 12.5 mg Tabs   nitroGLYCERIN 0.4 MG SL tablet   pantoprazole 40 MG tablet         Information on where to get these meds is not yet available. Ask your nurse or doctor.         aspirin 81 MG EC tablet           Outstanding Labs/Studies: None  Duration of Discharge Encounter: Greater than 30 minutes including physician time.  Signed, R. Hurman Horn, PA-C 10/23/2012, 1:50 PM

## 2012-10-23 NOTE — Progress Notes (Signed)
Subjective:  No chest pain or dyspnea. Anxious to go home. Cardiac cath showed no CAD. LV function normal.  Objective:  Vital Signs in the last 24 hours: Temp:  [97.4 F (36.3 C)-98.7 F (37.1 C)] 98 F (36.7 C) (12/17 0500) Pulse Rate:  [57-84] 74  (12/17 0500) Resp:  [13-20] 20  (12/17 0500) BP: (109-143)/(57-81) 118/57 mmHg (12/17 0500) SpO2:  [96 %-100 %] 98 % (12/17 0500) Weight:  [268 lb 11.9 oz (121.9 kg)] 268 lb 11.9 oz (121.9 kg) (12/17 0000)  Intake/Output from previous day: 12/16 0701 - 12/17 0700 In: 33 [I.V.:33] Out: 700 [Urine:700] Intake/Output from this shift:       . aspirin EC  81 mg Oral Daily  . atorvastatin  40 mg Oral q1800  . influenza  inactive virus vaccine  0.5 mL Intramuscular Tomorrow-1000  . metoprolol tartrate  12.5 mg Oral BID  . pantoprazole  40 mg Oral Daily  . pneumococcal 23 valent vaccine  0.5 mL Intramuscular Tomorrow-1000  . sodium chloride  3 mL Intravenous Q12H      Physical Exam: The patient appears to be in no distress.  Head and neck exam reveals that the pupils are equal and reactive.  The extraocular movements are full.  There is no scleral icterus.  Mouth and pharynx are benign.  No lymphadenopathy.  No carotid bruits.  The jugular venous pressure is normal.  Thyroid is not enlarged or tender.  Chest is clear to percussion and auscultation.  No rales or rhonchi.  Expansion of the chest is symmetrical.  Heart reveals no abnormal lift or heave.  First and second heart sounds are normal.  There is no murmur gallop rub or click.  The abdomen is soft and nontender.  Bowel sounds are normoactive.  There is no hepatosplenomegaly or mass.  There are no abdominal bruits.  Extremities reveal no phlebitis or edema.  Pedal pulses are good.  There is no cyanosis or clubbing.  Neurologic exam is normal strength and no lateralizing weakness.  No sensory deficits.  Integument reveals no rash  Lab Results:  Basename 10/23/12 0425  10/22/12 0958  WBC 6.5 6.1  HGB 14.7 16.4  PLT 223 225    Basename 10/23/12 0425 10/22/12 0440  NA 141 140  K 4.3 3.9  CL 104 103  CO2 27 26  GLUCOSE 114* 98  BUN 12 9  CREATININE 1.11 1.00    Basename 10/22/12 0440 10/21/12 2351  TROPONINI 0.77* 1.02*   Hepatic Function Panel No results found for this basename: PROT,ALBUMIN,AST,ALT,ALKPHOS,BILITOT,BILIDIR,IBILI in the last 72 hours  Basename 10/22/12 0440  CHOL 177   No results found for this basename: PROTIME in the last 72 hours  Imaging: Dg Chest Port 1 View  10/21/2012  *RADIOLOGY REPORT*  Clinical Data: Chest pain and shortness of breath.  PORTABLE CHEST - 1 VIEW  Comparison: 10/21/2012 at 11:39 a.m.  Findings: Lungs are adequately inflated without consolidation or effusion.  The cardiomediastinal silhouette and remainder of the exam is unchanged.  IMPRESSION: No acute cardiopulmonary disease.   Original Report Authenticated By: Elberta Fortis, M.D.     Cardiac Studies:   Telemetry is normal Assessment/Plan:  1. chest pain      Assessment: Non-STEMI with mild enzyme elevation of troponin and no EKG changes.  Troponins are 1.94, 1.02, and 0.77. Normal cath. May have been caused by transient coronary spasm related to smoking and stress.      Plan:Home today on ASA, BB and  statin. 2.  Hypercholesterolemia      Assessment: LDL 118, HDL 28      Plan: Add Lipitor 40 mg daily 3.  tobacco abuse      Assessment: Smokes 1-2 packs of cigarettes a day       Plan: Smoking cessation strongly advised.  He plans to get a family doctor and try chantix. 4.  Possible GERD      Plan: protonix or omeprazole.  Plan:  Home later today after echo is done. Follow up with me or NP/PA in 3-4 weeks  LOS: 2 days    Cassell Clement 10/23/2012, 8:15 AM

## 2012-10-23 NOTE — Progress Notes (Signed)
*  PRELIMINARY RESULTS* Echocardiogram 2D Echocardiogram has been performed.  Brian Deleon 10/23/2012, 10:03 AM

## 2012-10-25 ENCOUNTER — Telehealth: Payer: Self-pay | Admitting: *Deleted

## 2012-10-25 NOTE — Telephone Encounter (Signed)
Patient called with concerns of fever since last evening.  Running around 101.2, did have some slight pressure in chest.  States he took an Aspirin and felt better.  Today fever running 101.7, 101.8 - feels pretty okay, little pressure in head.  States he has also had a lot of stress with death of grandmother during this episode.  Advised to not take extra ASA for fever, but Tylenol would be better choice.  Will discuss with provider in light of recent MI.  Discussed above with Gene Serpe, PA - agreed with plan above.  Continue all cardiac meds as given at discharge.  Continue his ASA at 81mg  daily & use his NTG as needed for severe chest pain only.  He should also take Tylenol every 6 hours as needed for the fever & continue to monitor symptoms.  If symptoms worsen, he should go to urgent care / ED for evaluation as he does not have a PMD.   Patient already has eph visit scheduled for Monday, 12/23 with Gene.  Patient advised of above & verbalized understanding.

## 2012-10-29 ENCOUNTER — Ambulatory Visit (INDEPENDENT_AMBULATORY_CARE_PROVIDER_SITE_OTHER): Payer: Self-pay | Admitting: Physician Assistant

## 2012-10-29 ENCOUNTER — Encounter: Payer: Self-pay | Admitting: Physician Assistant

## 2012-10-29 VITALS — BP 141/87 | HR 91 | Ht 72.0 in | Wt 284.0 lb

## 2012-10-29 DIAGNOSIS — Z79899 Other long term (current) drug therapy: Secondary | ICD-10-CM

## 2012-10-29 DIAGNOSIS — E785 Hyperlipidemia, unspecified: Secondary | ICD-10-CM

## 2012-10-29 DIAGNOSIS — R509 Fever, unspecified: Secondary | ICD-10-CM | POA: Insufficient documentation

## 2012-10-29 MED ORDER — VARENICLINE TARTRATE 0.5 MG X 11 & 1 MG X 42 PO MISC: ORAL | Status: DC

## 2012-10-29 MED ORDER — AMLODIPINE BESYLATE 5 MG PO TABS: 5.0000 mg | ORAL_TABLET | Freq: Every day | ORAL | Status: DC

## 2012-10-29 NOTE — Assessment & Plan Note (Signed)
We'll prescribe Chantix, which patient requested, to assist in complete smoking cessation.

## 2012-10-29 NOTE — Progress Notes (Signed)
Primary Cardiologist: Simona Huh, MD (new)   HPI: Post hospital followup from [facility] (12/15-12/17/13), transferred directly from [facility] ED for evaluation of CP with abnormal troponins, in context of no known history of heart disease.   - Cardiac catheterization, December 16: Nonobstructive CAD; EF 55-65%, question tiny area of inferobasal HK  Conclusion was that the NST EMI (type II) was most likely secondary to transient coronary vasospasm, induced by stress/tobacco. Peak troponin 1.9. No significant EKG changes noted. Medical therapy recommended, including prn NTG.  Clinically, he denies any further CP. However, he has been plagued by intermittent fever as high as 103, chills, and joint aches. He denies any productive cough or dysuria. He attributes these symptoms on to the Lopressor, on which he was discharged. He denied any fever prior to his hospitalization.  Regarding smoking, he has cut back significantly from a previous level of 1-2 ppd, currently down to1/2 ppd.  He denies any complications of R wrist incision site.  Allergies  Allergen Reactions  . Sulfur Other (See Comments)    Reaction unknown    Current Outpatient Prescriptions  Medication Sig Dispense Refill  . aspirin EC 81 MG EC tablet Take 1 tablet (81 mg total) by mouth daily.      Marland Kitchen atorvastatin (LIPITOR) 40 MG tablet Take 1 tablet (40 mg total) by mouth daily at 6 PM.  30 tablet  3  . nitroGLYCERIN (NITROSTAT) 0.4 MG SL tablet Place 1 tablet (0.4 mg total) under the tongue every 5 (five) minutes as needed for chest pain.  25 tablet  12  . pantoprazole (PROTONIX) 40 MG tablet Take 1 tablet (40 mg total) by mouth daily.  30 tablet  3    Past Medical History  Diagnosis Date  . GERD (gastroesophageal reflux disease)   . Hyperlipidemia   . Tobacco abuse   . Non-ST elevation MI (NSTEMI)     Question coronary vasospasm with angiographically NL cors; EF 55-65%, question tiny inferobasal HK, cardiac catheterization, 10/2012     Past Surgical History  Procedure Date  . Cardiac catheterization 10/22/2012    angiographically normal coronaries; LVEF 55-65%; questionable inferobasal HK    History   Social History  . Marital Status: Single    Spouse Name: N/A    Number of Children: N/A  . Years of Education: N/A   Occupational History  . Mechanic    Social History Main Topics  . Smoking status: Current Every Day Smoker -- 0.5 packs/day for 15 years  . Smokeless tobacco: Not on file  . Alcohol Use: No  . Drug Use: No  . Sexually Active: Yes   Other Topics Concern  . Not on file   Social History Narrative   Works as a Curator    Family History  Problem Relation Age of Onset  . Heart attack Father   . Hypertension Father     ROS: no nausea, vomiting; no fever, chills; no melena, hematochezia; no claudication  PHYSICAL EXAM: BP 141/87  Pulse 91  Ht 6' (1.829 m)  Wt 284 lb (128.822 kg)  BMI 38.52 kg/m2 GENERAL: 31 year old male, obese ; NAD HEENT: NCAT, PERRLA, EOMI; sclera clear; no xanthelasma NECK: palpable bilateral carotid pulses, no bruits; no JVD; no TM LUNGS:  diminished breath sounds, with no crackles/wheezes  CARDIAC: RRR (S1, S2); no significant murmurs; no rubs or gallops ABDOMEN:  protuberant  EXTREMETIES:  Palpable RP, well-healed incision site; no significant peripheral edema SKIN: warm/dry; no obvious rash/lesions MUSCULOSKELETAL: no joint deformity  NEURO: no focal deficit; NL affect   EKG:    ASSESSMENT & PLAN:  NSTEMI (non-ST elevated myocardial infarction) Angiographically normal coronary arteries with probable vasospasm etiology for NST EMI, in setting of stress/tobacco. Will substituted Lopressor with Norvasc 5 mg daily, for prophylaxis against recurrent spasm. Moreover, patient attributes subsequent myriad constitutional symptoms, including fever, to side effects from Lopressor. He is to otherwise continue current medication regimen, and return in 3 months to  establish with Dr. Diona Browner.  Tobacco abuse We'll prescribe Chantix, which patient requested, to assist in complete smoking cessation.  Hyperlipidemia Will reassess lipid status in 12 weeks. Aggressive management recommended with target LDL 70 or less, if feasible.  Fever We'll order urinalysis. Patient otherwise to take acetaminophen prn.    Brian Deleon, PAC

## 2012-10-29 NOTE — Assessment & Plan Note (Signed)
Angiographically normal coronary arteries with probable vasospasm etiology for NST EMI, in setting of stress/tobacco. Will substituted Lopressor with Norvasc 5 mg daily, for prophylaxis against recurrent spasm. Moreover, patient attributes subsequent myriad constitutional symptoms, including fever, to side effects from Lopressor. He is to otherwise continue current medication regimen, and return in 3 months to establish with Dr. Diona Browner.

## 2012-10-29 NOTE — Assessment & Plan Note (Signed)
Will reassess lipid status in 12 weeks. Aggressive management recommended with target LDL 70 or less, if feasible.

## 2012-10-29 NOTE — Patient Instructions (Addendum)
Your physician recommends that you schedule a follow-up appointment in: 3 months. Your physician has recommended you make the following change in your medication: stop lopressor (metoprolol). Start amlodipine 5 mg daily. Start chantix starter pack. All other medications will remain the same. Your new prescription has been sent to your pharmacy. Your physician recommends that you return for a FASTING lipid/liver profile in 12 weeks at [facility] around March 17 th, 2014 just before your next appointment. Please don't eat or drink for at least 8 hours when you have this done.  Your physician recommends that you return for lab work today at [facility] for clean catch urine specimen for UA.

## 2012-10-29 NOTE — Assessment & Plan Note (Signed)
We'll order urinalysis. Patient otherwise to take acetaminophen prn.

## 2012-11-02 ENCOUNTER — Telehealth: Payer: Self-pay | Admitting: *Deleted

## 2012-11-02 NOTE — Telephone Encounter (Signed)
Following up on UA that was to be done on 12/23.  Per [facility]) order cancelled due to patient unable to give specimen.   Gene Serpe, PA aware.  Discussed above with patient.  States was going to try to go today, but is home with his daughter now & has a lot of things going on with his family.  Also, stated he has not been able to find PMD yet.  States he is still running fever.  Advised patient that he could check with health department or go to an urgent care (they should be able to give urine results at time of visit) and treat if necessary.  Patient verbalized understanding.

## 2012-11-02 NOTE — Telephone Encounter (Signed)
Message copied by Lesle Chris on Fri Nov 02, 2012  1:48 PM ------      Message from: Eustace Moore      Created: Mon Oct 29, 2012  2:20 PM      Regarding: UA (clean catch)       For f/u      Being done today 10/29/12

## 2012-11-08 ENCOUNTER — Telehealth: Payer: Self-pay | Admitting: Cardiology

## 2012-11-08 ENCOUNTER — Telehealth: Payer: Self-pay | Admitting: Physician Assistant

## 2012-11-08 NOTE — Telephone Encounter (Signed)
Patient went to [facility] today and was told to let the nurse know what was going on and what hospital said. [facility] told him to talk to nurse to find out if he needs to follow up with our office??

## 2012-11-08 NOTE — Telephone Encounter (Signed)
Spoke with patient and he said that he is sob on exertion after moving around a lot. Patient said it doesn't feel like he's getting a deep enough breath. Patient c/o of lightheadedness when he got sob. Patient also c/o soreness in chest. No c/o fever. Patient informed that he needed to go to ED for evaluation. Patient verbalized understanding of plan.

## 2012-11-08 NOTE — Telephone Encounter (Signed)
Brian Deleon called stating that he is having Extreme shortness of breath .

## 2012-11-09 ENCOUNTER — Ambulatory Visit (INDEPENDENT_AMBULATORY_CARE_PROVIDER_SITE_OTHER): Payer: Self-pay | Admitting: Cardiology

## 2012-11-09 ENCOUNTER — Encounter: Payer: Self-pay | Admitting: Cardiology

## 2012-11-09 VITALS — BP 129/89 | HR 91 | Ht 72.0 in | Wt 283.0 lb

## 2012-11-09 DIAGNOSIS — F17200 Nicotine dependence, unspecified, uncomplicated: Secondary | ICD-10-CM

## 2012-11-09 DIAGNOSIS — Z720 Tobacco use: Secondary | ICD-10-CM

## 2012-11-09 DIAGNOSIS — I214 Non-ST elevation (NSTEMI) myocardial infarction: Secondary | ICD-10-CM

## 2012-11-09 DIAGNOSIS — E785 Hyperlipidemia, unspecified: Secondary | ICD-10-CM

## 2012-11-09 NOTE — Progress Notes (Signed)
HPI The patient presented with chest discomfort to the ER on 1/2.  He felt like he was unable to get a deep breath and he had some discomfort when he tried to breath. This was not like his previous coronary spasm/NQWMI.  I reviewed these records and there were no acute EKG changes and no enzyme elevations.  He was felt to be having anxiety and was discharged from the ER.  Since that visit he has had no further symptoms.  He reports that he became light headed after becoming angry while doing some work for his dad.  He reports that "flying off the handle" has been a problem all of his life.  The patient denies any new symptoms such as chest discomfort, neck or arm discomfort. There has been no new shortness of breath, PND or orthopnea. There have been no reported palpitations, presyncope or syncope.  He has had no lightheadedness or other symptoms since his ER visit.   I reviewed the ER records including the EKG.  Allergies  Allergen Reactions  . Sulfur Other (See Comments)    Reaction unknown    Current Outpatient Prescriptions  Medication Sig Dispense Refill  . amLODipine (NORVASC) 5 MG tablet Take 1 tablet (5 mg total) by mouth daily.  180 tablet  3  . aspirin EC 81 MG EC tablet Take 1 tablet (81 mg total) by mouth daily.      Marland Kitchen atorvastatin (LIPITOR) 40 MG tablet Take 1 tablet (40 mg total) by mouth daily at 6 PM.  30 tablet  3  . nitroGLYCERIN (NITROSTAT) 0.4 MG SL tablet Place 1 tablet (0.4 mg total) under the tongue every 5 (five) minutes as needed for chest pain.  25 tablet  12  . pantoprazole (PROTONIX) 40 MG tablet Take 1 tablet (40 mg total) by mouth daily.  30 tablet  3    Past Medical History  Diagnosis Date  . GERD (gastroesophageal reflux disease)   . Hyperlipidemia   . Tobacco abuse   . Non-ST elevation MI (NSTEMI)     Question coronary vasospasm with angiographically NL cors; EF 55-65%, question tiny inferobasal HK, cardiac catheterization, 10/2012    Past Surgical  History  Procedure Date  . Cardiac catheterization 10/22/2012    angiographically normal coronaries; LVEF 55-65%; questionable inferobasal HK    ROS:  As stated in the HPI and negative for all other systems.  PHYSICAL EXAM BP 129/89  Pulse 91  Ht 6' (1.829 m)  Wt 283 lb (128.368 kg)  BMI 38.38 kg/m2 GENERAL:  Well appearing HEENT:  Pupils equal round and reactive, fundi not visualized, oral mucosa unremarkable NECK:  No jugular venous distention, waveform within normal limits, carotid upstroke brisk and symmetric, no bruits, no thyromegaly LYMPHATICS:  No cervical, inguinal adenopathy LUNGS:  Clear to auscultation bilaterally BACK:  No CVA tenderness CHEST:  Unremarkable HEART:  PMI not displaced or sustained,S1 and S2 within normal limits, no S3, no S4, no clicks, no rubs, no murmurs ABD:  Flat, positive bowel sounds normal in frequency in pitch, no bruits, no rebound, no guarding, no midline pulsatile mass, no hepatomegaly, no splenomegaly EXT:  2 plus pulses throughout, no edema, no cyanosis no clubbing SKIN:  No rashes no nodules NEURO:  Cranial nerves II through XII grossly intact, motor grossly intact throughout PSYCH:  Cognitively intact, oriented to person place and time   ASSESSMENT AND PLAN'   NSTEMI (non-ST elevated myocardial infarction)  Current symptoms are atypical and likely related  to anxiety.  He was evaluated at the ER and not found to have any objective evidence of ischemia.  No further cardiac work up is indicated.  Continue with current therapy.  Tobacco abuse  The patient could not afford Chantix and he will need to try to quit "cold [place]".    Anxiety I spoke with the Health Department who will see him next week.  I spoke with [facility] and they would only be able to offer group therapy.  I spoke at length with the patient about his anger issues.  He has no thoughts of hurting himself or others but he would like to have medical  and or other therapy for this.  I have made the referrals as above.  I also described an exercise program that he should try to employ.

## 2012-11-09 NOTE — Telephone Encounter (Signed)
Discussed with patient during office visit today

## 2012-11-09 NOTE — Patient Instructions (Addendum)
Your physician recommends that you schedule a follow-up appointment in: 6 months. You will receive a reminder letter in the mail in about 4 months reminding you to call and schedule your appointment. If you don't receive this letter, please contact our office. Your physician recommends that you continue on your current medications as directed. Please refer to the Current Medication list given to you today.  Please follow up with the Health Department as scheduled.

## 2012-11-12 ENCOUNTER — Telehealth: Payer: Self-pay | Admitting: Cardiology

## 2012-11-12 NOTE — Telephone Encounter (Signed)
Brian Deleon would like to know more information about the diagnosis that was given to him on last office visit

## 2012-11-12 NOTE — Telephone Encounter (Signed)
Spoke with patient and he wanted to know what he was diagnosed with and what MD told him while he was in the room. Nurse reviewed listed problems with patient. 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 Nurse informed patient that message would be forwarded to MD.

## 2013-02-01 ENCOUNTER — Ambulatory Visit: Payer: Self-pay | Admitting: Cardiology

## 2013-02-25 ENCOUNTER — Other Ambulatory Visit (HOSPITAL_COMMUNITY): Payer: Self-pay | Admitting: Physician Assistant

## 2013-03-21 ENCOUNTER — Other Ambulatory Visit (HOSPITAL_COMMUNITY): Payer: Self-pay | Admitting: Physician Assistant

## 2013-05-06 ENCOUNTER — Ambulatory Visit (INDEPENDENT_AMBULATORY_CARE_PROVIDER_SITE_OTHER): Payer: Medicaid Other | Admitting: Cardiology

## 2013-05-06 ENCOUNTER — Encounter: Payer: Self-pay | Admitting: Cardiology

## 2013-05-06 VITALS — BP 108/70 | HR 75 | Ht 72.0 in | Wt 271.8 lb

## 2013-05-06 DIAGNOSIS — R0609 Other forms of dyspnea: Secondary | ICD-10-CM

## 2013-05-06 DIAGNOSIS — R0683 Snoring: Secondary | ICD-10-CM

## 2013-05-06 DIAGNOSIS — R5381 Other malaise: Secondary | ICD-10-CM

## 2013-05-06 DIAGNOSIS — R5383 Other fatigue: Secondary | ICD-10-CM

## 2013-05-06 DIAGNOSIS — R079 Chest pain, unspecified: Secondary | ICD-10-CM

## 2013-05-06 MED ORDER — AMLODIPINE BESYLATE 2.5 MG PO TABS: 2.5000 mg | ORAL_TABLET | Freq: Every day | ORAL | Status: DC

## 2013-05-06 MED ORDER — OMEPRAZOLE 20 MG PO CPDR: 20.0000 mg | DELAYED_RELEASE_CAPSULE | Freq: Every day | ORAL | Status: DC

## 2013-05-06 NOTE — Patient Instructions (Addendum)
Your physician recommends that you schedule a follow-up appointment in: 1 year. You will receive a reminder letter in the mail in about 10 months reminding you to call and schedule your appointment. If you don't receive this letter, please contact our office. Your physician has recommended you make the following change in your medication: Decrease your amlodipine to 2.5 mg daily. You may break your 5 mg tablet in 1/2 daily until they are finished. Stop protonix and start prilosec 20 mg daily. Your new prescriptions have been sent to your pharmacy. All other medications will remain the same. You have been referred to ENT for a Sleep Study.

## 2013-05-06 NOTE — Progress Notes (Signed)
HPI The patient presents for followup of coronary spasm/NQWMI. Since I last saw him he has continued to have some pinching discomfort which was unlike his previous presentation with non-Q-wave myocardial infarction. He hasn't taken any nitroglycerin. 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 However, he still has some anxiety. His biggest complaint today seems to be fatigue. He says he can work for a day and then has to recover for a day. He does say he snores significantly at night. He's never been told he has apnea. He's not having any classic substernal chest pressure, neck or arm discomfort. He's not having any obvious palpitations, presyncope or syncope. He does get dyspnea with exertion which is unchanged from previous but he's not having PND or orthopnea.  Allergies  Allergen Reactions  . Sulfur Other (See Comments)    Reaction unknown    Current Outpatient Prescriptions  Medication Sig Dispense Refill  . amLODipine (NORVASC) 5 MG tablet Take 1 tablet (5 mg total) by mouth daily.  180 tablet  3  . aspirin EC 81 MG EC tablet Take 1 tablet (81 mg total) by mouth daily.      Marland Kitchen atorvastatin (LIPITOR) 40 MG tablet TAKE 1 TABLET BY MOUTH EVERY EVENING AT 6PM  30 tablet  3  . citalopram (CELEXA) 20 MG tablet Take 20 mg by mouth daily.      . nitroGLYCERIN (NITROSTAT) 0.4 MG SL tablet Place 1 tablet (0.4 mg total) under the tongue every 5 (five) minutes as needed for chest pain.  25 tablet  12  . pantoprazole (PROTONIX) 40 MG tablet TAKE 1 TABLET BY MOUTH EVERY DAY  30 tablet  3   No current facility-administered medications for this visit.    Past Medical History  Diagnosis Date  . GERD (gastroesophageal reflux disease)   . Hyperlipidemia   . Tobacco abuse   . Non-ST elevation MI (NSTEMI)     Question coronary vasospasm with angiographically NL cors; EF 55-65%, question tiny inferobasal HK, cardiac catheterization, 10/2012    Past Surgical History  Procedure  Laterality Date  . Cardiac catheterization  10/22/2012    angiographically normal coronaries; LVEF 55-65%; questionable inferobasal HK    ROS:  As stated in the HPI and negative for all other systems.  PHYSICAL EXAM BP 108/70  Pulse 75  Ht 6' (1.829 m)  Wt 271 lb 12.8 oz (123.288 kg)  BMI 36.85 kg/m2 GENERAL:  Well appearing NECK:  No jugular venous distention, waveform within normal limits, carotid upstroke brisk and symmetric, no bruits, no thyromegaly LYMPHATICS:  No cervical, inguinal adenopathy LUNGS:  Clear to auscultation bilaterally BACK:  No CVA tenderness CHEST:  Unremarkable HEART:  PMI not displaced or sustained,S1 and S2 within normal limits, no S3, no S4, no clicks, no rubs, no murmurs ABD:  Flat, positive bowel sounds normal in frequency in pitch, no bruits, no rebound, no guarding, no midline pulsatile mass, no hepatomegaly, no splenomegaly EXT:  2 plus pulses throughout, no edema, no cyanosis no clubbing PSYCH:  Cognitively intact, oriented to person place and time  EKG:  Normal sinus rhythm, rate 75, axis within normal limits, intervals within normal limits, no acute ST-T wave changes. 05/06/2013  ASSESSMENT AND PLAN'   NSTEMI (non-ST elevated myocardial infarction)/coronary spasm. He continues to have atypical symptoms. No further cardiac workup is suggested.  Tobacco abuse  The patient could not afford Chantix and he will need to try to quit "cold [place]".   We have  discussed this at length in the past.  Anxiety I am glad that he is getting this addressed. He should continue to follow with primary providers.  Fatigue I suspect this is multifactorial. However, with this and his significant snoring he needs to be screened for sleep apnea.  Because of this complaint in his low blood pressure today I will reduce his Norvasc to 2.5 mg daily.  Reflux He describes symptoms consistent with reflux. We discussed foods that would exacerbate this. I will switch him to  Prilosec.

## 2013-05-07 ENCOUNTER — Telehealth: Payer: Self-pay | Admitting: Cardiology

## 2013-05-07 NOTE — Telephone Encounter (Signed)
Patient is [place] Medicaid -will have to call Dr. Jeannette How office in order to get authorization to refer to another specialist   Dr. Dimas Aguas will have to schedule this referral. Called Dr.Howard's office.  Mr. Belarus has been discharged from the practice. His Medicaid card is showing [facility] Health Department so will call and see if they can authorize this patient to have The sleep study.

## 2013-05-15 ENCOUNTER — Ambulatory Visit: Payer: Medicaid Other | Admitting: Cardiology

## 2013-05-22 ENCOUNTER — Telehealth: Payer: Self-pay | Admitting: Cardiology

## 2013-05-22 NOTE — Telephone Encounter (Signed)
Would like results of his blood work  Also his PCP referred him to have sleep study done

## 2013-05-23 NOTE — Telephone Encounter (Signed)
Labs OK. Call Mr. Belarus with the results and send results to @PCP  per Dr. Antoine Poche. Patient informed and copy sent to PCP.

## 2013-06-17 NOTE — Telephone Encounter (Signed)
Called [place] Health department today. Left another message asking someone to contact our office in regards to  This patient's medicaid.

## 2013-09-02 ENCOUNTER — Other Ambulatory Visit (HOSPITAL_COMMUNITY): Payer: Self-pay | Admitting: Physician Assistant

## 2013-11-08 ENCOUNTER — Other Ambulatory Visit: Payer: Self-pay | Admitting: Physician Assistant

## 2014-02-26 ENCOUNTER — Encounter: Payer: Self-pay | Admitting: Cardiovascular Disease

## 2014-02-26 ENCOUNTER — Ambulatory Visit (INDEPENDENT_AMBULATORY_CARE_PROVIDER_SITE_OTHER): Payer: Medicaid Other | Admitting: Cardiovascular Disease

## 2014-02-26 VITALS — BP 138/81 | HR 80 | Ht 72.0 in | Wt 263.0 lb

## 2014-02-26 DIAGNOSIS — I209 Angina pectoris, unspecified: Secondary | ICD-10-CM

## 2014-02-26 DIAGNOSIS — R5381 Other malaise: Secondary | ICD-10-CM

## 2014-02-26 DIAGNOSIS — F419 Anxiety disorder, unspecified: Secondary | ICD-10-CM

## 2014-02-26 DIAGNOSIS — F411 Generalized anxiety disorder: Secondary | ICD-10-CM

## 2014-02-26 DIAGNOSIS — E785 Hyperlipidemia, unspecified: Secondary | ICD-10-CM

## 2014-02-26 DIAGNOSIS — R5383 Other fatigue: Secondary | ICD-10-CM

## 2014-02-26 DIAGNOSIS — F17200 Nicotine dependence, unspecified, uncomplicated: Secondary | ICD-10-CM

## 2014-02-26 DIAGNOSIS — Z720 Tobacco use: Secondary | ICD-10-CM

## 2014-02-26 DIAGNOSIS — I201 Angina pectoris with documented spasm: Secondary | ICD-10-CM

## 2014-02-26 NOTE — Patient Instructions (Signed)
Continue all current medications. Your physician wants you to follow up in:  1 year.  You will receive a reminder letter in the mail one-two months in advance.  If you don't receive a letter, please call our office to schedule the follow up appointment   

## 2014-02-26 NOTE — Progress Notes (Signed)
Patient ID: Brian Deleon, male   DOB: 08/18/1981, 33 y.o.   MRN: [id number]      SUBJECTIVE: The patient is a 33 year old male with a history of coronary artery spasm and non-STEMI. This is my first time meeting him. He also has a history of GERD, tobacco abuse, and anxiety. He has been doing well from a cardiac standpoint. He very seldom experiences fleeting chest twinges which only last seconds. He has been very stressed lately as he has been trying to start a business, and has been smoking 2-2-1/2 packs of cigarettes daily. With regards to fatigue, some days he gets tired out easily and some days he feels fine. He also feels this is related to his anxiety and depression. 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 He used to struggle with anger issues but they have since subsided. He denies shortness of breath, palpitations and leg swelling.  His father had an MI at age 34.    Allergies  Allergen Reactions  . Sulfur Other (See Comments)    Reaction unknown    Current Outpatient Prescriptions  Medication Sig Dispense Refill  . amLODipine (NORVASC) 2.5 MG tablet Take 1 tablet (2.5 mg total) by mouth daily.  180 tablet  3  . aspirin EC 81 MG EC tablet Take 1 tablet (81 mg total) by mouth daily.      Marland Kitchen. atorvastatin (LIPITOR) 40 MG tablet TAKE 1 TABLET BY MOUTH EVERY EVENING AT 6PM  30 tablet  5  . gabapentin (NEURONTIN) 100 MG capsule Take 100 mg by mouth daily.      . nitroGLYCERIN (NITROSTAT) 0.4 MG SL tablet Place 1 tablet (0.4 mg total) under the tongue every 5 (five) minutes as needed for chest pain.  25 tablet  12  . omeprazole (PRILOSEC) 20 MG capsule Take 1 capsule (20 mg total) by mouth daily.  30 capsule  11   No current facility-administered medications for this visit.    Past Medical History  Diagnosis Date  . GERD (gastroesophageal reflux disease)   . Hyperlipidemia   . Tobacco abuse   . Non-ST elevation MI (NSTEMI)     Question coronary vasospasm with  angiographically NL cors; EF 55-65%, question tiny inferobasal HK, cardiac catheterization, 10/2012    Past Surgical History  Procedure Laterality Date  . Cardiac catheterization  10/22/2012    angiographically normal coronaries; LVEF 55-65%; questionable inferobasal HK    History   Social History  . Marital Status: Single    Spouse Name: N/A    Number of Children: N/A  . Years of Education: N/A   Occupational History  . Mechanic    Social History Main Topics  . Smoking status: Current Every Day Smoker -- 0.50 packs/day for 15 years  . Smokeless tobacco: Not on file  . Alcohol Use: No  . Drug Use: No  . Sexual Activity: Yes   Other Topics Concern  . Not on file   Social History Narrative   Works as a Engineer, materialsmechanic     Filed Vitals:   02/26/14 1322  BP: 138/81  Pulse: 80  Height: 6' (1.829 m)  Weight: 263 lb (119.296 kg)  SpO2: 98%    PHYSICAL EXAM General: NAD Neck: No JVD, no thyromegaly. Lungs: Clear to auscultation bilaterally with normal respiratory effort. CV: Nondisplaced PMI.  Regular rate and rhythm, normal S1/S2, no S3/S4, no murmur. No pretibial or periankle edema.  No carotid bruit.  Normal pedal pulses.  Abdomen: Soft, nontender,  no hepatosplenomegaly, no distention.  Neurologic: Alert and oriented x 3.  Psych: Normal affect. Extremities: No clubbing or cyanosis.   ECG: reviewed and available in electronic records.      ASSESSMENT AND PLAN: 1. Coronary spasm with h/o NSTEMI: Symptomatically stable. Continue ASA, low-dose amlodipine for now, and Lipitor 40 mg daily.  Dispo: f/u 1 year.   Prentice DockerSuresh Koneswaran, M.D., F.A.C.C.

## 2014-07-08 ENCOUNTER — Other Ambulatory Visit: Payer: Self-pay

## 2014-07-08 MED ORDER — OMEPRAZOLE 20 MG PO CPDR: 20.0000 mg | DELAYED_RELEASE_CAPSULE | Freq: Every day | ORAL | Status: DC

## 2014-07-29 ENCOUNTER — Telehealth: Payer: Self-pay | Admitting: Cardiovascular Disease

## 2014-07-29 NOTE — Telephone Encounter (Signed)
Needs refill on nitroGLYCERIN (NITROSTAT) 0.4 MG SL tablet

## 2014-07-31 MED ORDER — NITROGLYCERIN 0.4 MG SL SUBL: 0.4000 mg | SUBLINGUAL_TABLET | SUBLINGUAL | Status: DC | PRN

## 2014-07-31 NOTE — Telephone Encounter (Signed)
Done

## 2014-09-27 IMAGING — CR DG CHEST 1V PORT
1 series · 1 of 1 positions shown · non-contrast
Comparison: 10/21/2012 at [DATE] a.m..

CLINICAL DATA: Chest pain and shortness of breath.

PORTABLE CHEST - 1 VIEW

[AP]
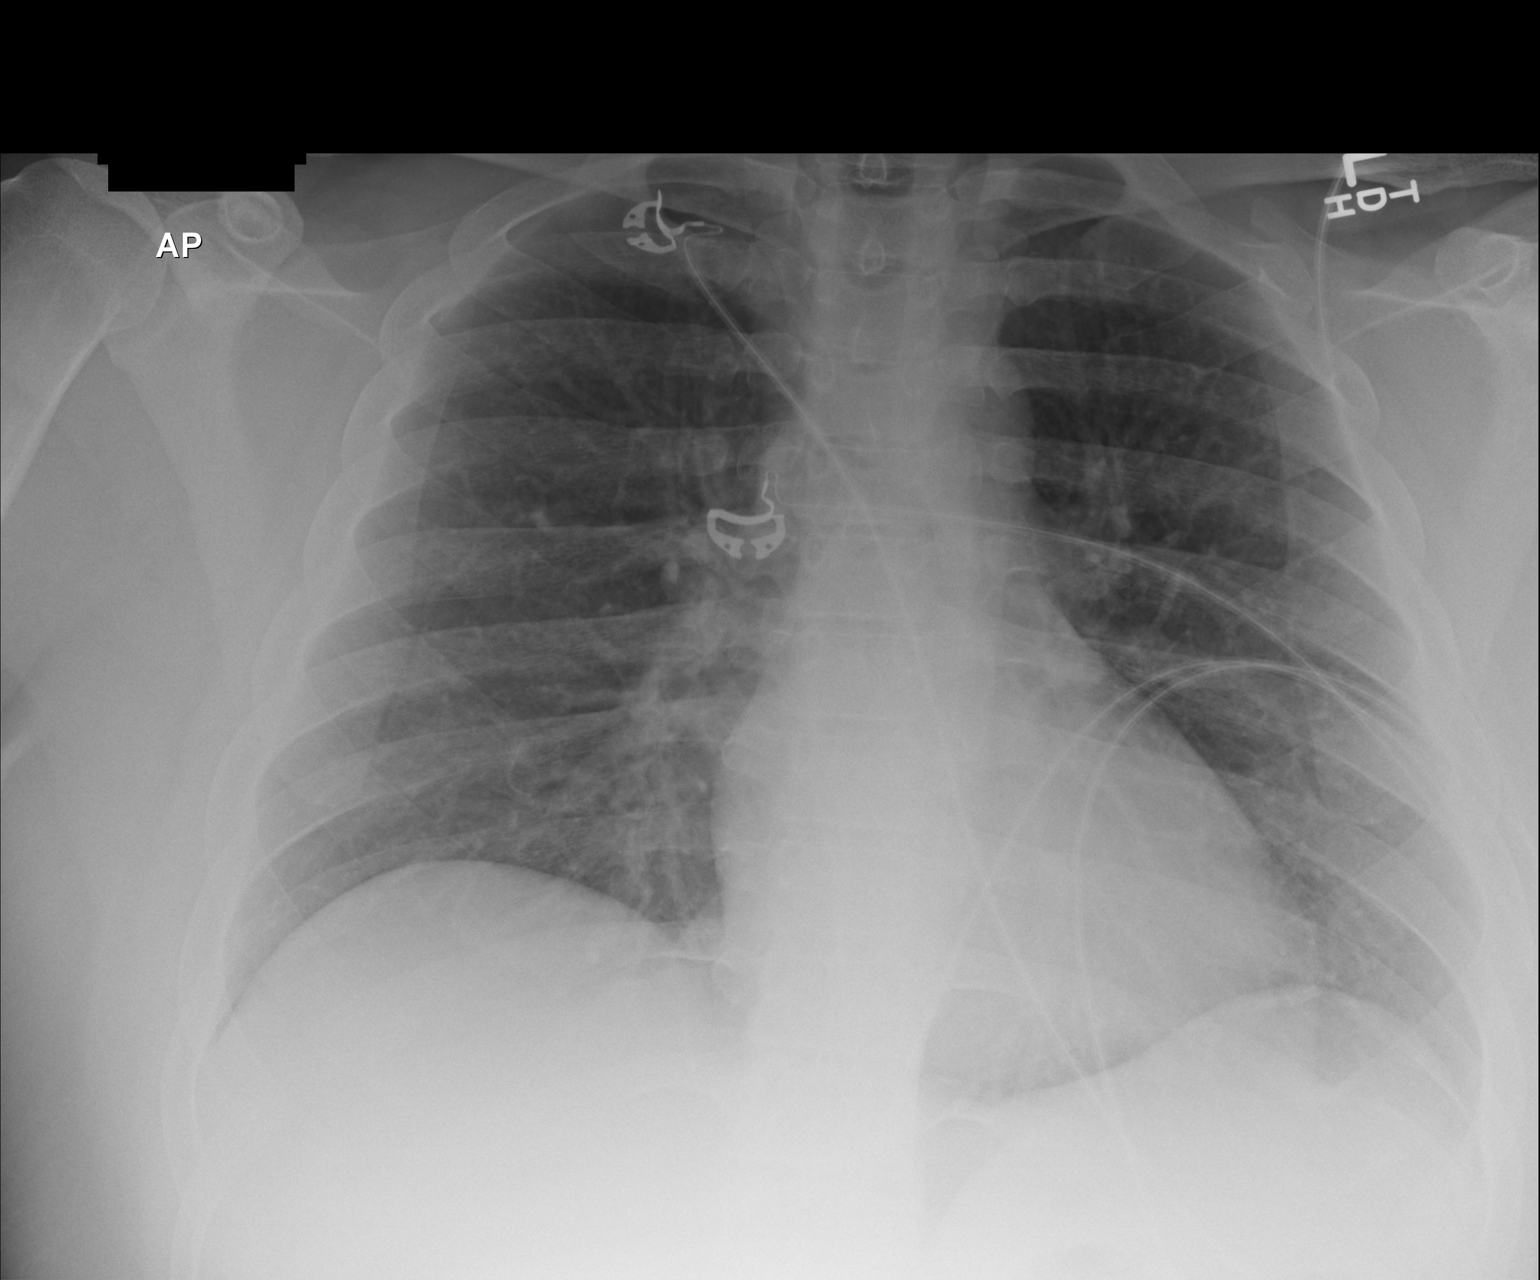

[1 of 1 positions shown; findings below may reference images not displayed]

FINDINGS: Lungs are adequately inflated without consolidation or
effusion.  The cardiomediastinal silhouette and remainder of the
exam is unchanged.
IMPRESSION: No acute cardiopulmonary disease.

## 2014-10-16 ENCOUNTER — Encounter (HOSPITAL_COMMUNITY): Payer: Self-pay | Admitting: Cardiology

## 2015-01-27 ENCOUNTER — Telehealth: Payer: Self-pay | Admitting: *Deleted

## 2015-01-27 MED ORDER — AMLODIPINE BESYLATE 2.5 MG PO TABS: 2.5000 mg | ORAL_TABLET | Freq: Every day | ORAL | Status: DC

## 2015-01-27 NOTE — Telephone Encounter (Signed)
Refill request for amlodipine 2.5 mg from [facility]. 1 month refill given pt needs appt for further refills

## 2015-03-05 ENCOUNTER — Other Ambulatory Visit: Payer: Self-pay | Admitting: *Deleted

## 2015-03-05 MED ORDER — AMLODIPINE BESYLATE 2.5 MG PO TABS: 2.5000 mg | ORAL_TABLET | Freq: Every day | ORAL | Status: DC

## 2015-03-06 ENCOUNTER — Ambulatory Visit: Payer: Medicaid Other | Admitting: Cardiovascular Disease

## 2015-03-24 ENCOUNTER — Encounter: Payer: Self-pay | Admitting: Cardiovascular Disease

## 2015-03-24 ENCOUNTER — Ambulatory Visit (INDEPENDENT_AMBULATORY_CARE_PROVIDER_SITE_OTHER): Payer: Medicaid Other | Admitting: Cardiovascular Disease

## 2015-03-24 VITALS — BP 127/83 | HR 72 | Ht 72.0 in | Wt 276.0 lb

## 2015-03-24 DIAGNOSIS — F419 Anxiety disorder, unspecified: Secondary | ICD-10-CM

## 2015-03-24 DIAGNOSIS — Z716 Tobacco abuse counseling: Secondary | ICD-10-CM

## 2015-03-24 DIAGNOSIS — I201 Angina pectoris with documented spasm: Secondary | ICD-10-CM | POA: Diagnosis not present

## 2015-03-24 DIAGNOSIS — I214 Non-ST elevation (NSTEMI) myocardial infarction: Secondary | ICD-10-CM

## 2015-03-24 DIAGNOSIS — E785 Hyperlipidemia, unspecified: Secondary | ICD-10-CM | POA: Diagnosis not present

## 2015-03-24 NOTE — Progress Notes (Signed)
Patient ID: Dionne AnoWilliam M BelarusSpain, male   DOB: 09/14/1981, 34 y.o.   MRN: [id number]      SUBJECTIVE: The patient presents for routine follow-up. He has a history of coronary artery spasm and non-STEMI, as well as GERD, tobacco abuse, and anxiety. He has been doing well from a cardiac standpoint. He very seldom experiences fleeting chest twinges which only last seconds and occur when he is stressed or anxious.  He also denies shortness of breath, palpitations and leg swelling.  ECG performed in the office today demonstrates normal sinus rhythm with no ischemic ST segment T-wave abnormalities.  He smokes 1-1/2-2 packs of cigarettes daily. He had been on Celexa but his behavioral health specialist took him off of it. He went back to smoking marijuana which helps to alleviate his stress and has also improved his appetite. He wants to quit smoking and was previously prescribed Chantix but it cost $300.  His father had an MI at age 35.  Review of Systems: As per "subjective", otherwise negative.  Allergies  Allergen Reactions  . Sulfur Other (See Comments)    Reaction unknown    Current Outpatient Prescriptions  Medication Sig Dispense Refill  . amLODipine (NORVASC) 2.5 MG tablet Take 1 tablet (2.5 mg total) by mouth daily. 30 tablet 3  . aspirin EC 81 MG EC tablet Take 1 tablet (81 mg total) by mouth daily.    Marland Kitchen. atorvastatin (LIPITOR) 40 MG tablet TAKE 1 TABLET BY MOUTH EVERY EVENING AT 6PM 30 tablet 5  . nitroGLYCERIN (NITROSTAT) 0.4 MG SL tablet Place 1 tablet (0.4 mg total) under the tongue every 5 (five) minutes as needed for chest pain. 25 tablet 3  . omeprazole (PRILOSEC) 20 MG capsule Take 1 capsule (20 mg total) by mouth daily. 30 capsule 6   No current facility-administered medications for this visit.    Past Medical History  Diagnosis Date  . GERD (gastroesophageal reflux disease)   . Hyperlipidemia   . Tobacco abuse   . Non-ST elevation MI (NSTEMI)     Question coronary  vasospasm with angiographically NL cors; EF 55-65%, question tiny inferobasal HK, cardiac catheterization, 10/2012    Past Surgical History  Procedure Laterality Date  . Cardiac catheterization  10/22/2012    angiographically normal coronaries; LVEF 55-65%; questionable inferobasal HK  . Left heart catheterization with coronary angiogram N/A 10/22/2012    Procedure: LEFT HEART CATHETERIZATION WITH CORONARY ANGIOGRAM;  Surgeon: Herby Abrahamhomas D Stuckey, MD;  [facility];  Service: Cardiovascular;  Laterality: N/A;    History   Social History  . Marital Status: Single    Spouse Name: N/A  . Number of Children: N/A  . Years of Education: N/A   Occupational History  . Mechanic    Social History Main Topics  . Smoking status: Current Every Day Smoker -- 0.50 packs/day for 15 years    Types: Cigarettes    Start date: 06/12/1998  . Smokeless tobacco: Never Used  . Alcohol Use: No  . Drug Use: No  . Sexual Activity: Yes   Other Topics Concern  . Not on file   Social History Narrative   Works as a Engineer, materialsmechanic     Filed Vitals:   03/24/15 1147  BP: 127/83  Pulse: 72  Height: 6' (1.829 m)  Weight: 276 lb (125.193 kg)  SpO2: 98%    PHYSICAL EXAM General: NAD HEENT: Normal. Neck: No JVD, no thyromegaly. Lungs: Diminished but clear w/o rales or wheezes. CV: Nondisplaced PMI.  Regular rate and rhythm, normal S1/S2, no S3/S4, no murmur. No pretibial or periankle edema.  No carotid bruit.  Abdomen: Obese, no distention.  Neurologic: Alert and oriented x 3.  Psych: Normal affect. Skin: Normal. Musculoskeletal: Normal range of motion, no gross deformities. Extremities: No clubbing or cyanosis.   ECG: Most recent ECG reviewed.      ASSESSMENT AND PLAN: 1. Coronary spasm with h/o NSTEMI: Symptomatically stable. Continue ASA, low-dose amlodipine, and Lipitor 40 mg daily.  2. Tobacco abuse: Cessation counseling given. Will see if Chantix manufacturer has an assistance  program. If not, would consider nicotine patches.  3. Anxiety/anger disorder: Recommended he f/u with behavioral health to see if he can obtain an anxiolytic.  Dispo: f/u 1 year.   Prentice DockerSuresh Koneswaran, M.D., F.A.C.C.

## 2015-03-24 NOTE — Patient Instructions (Signed)
   Office will check on alternative medications (smoking cessation) on Medicaid formulary and notify patient.   Continue all current medications. Your physician wants you to follow up in:  1 year.  You will receive a reminder letter in the mail one-two months in advance.  If you don't receive a letter, please call our office to schedule the follow up appointment

## 2015-03-24 NOTE — Addendum Note (Signed)
Addended by: Lesle ChrisHILL, Rolinda Impson G on: 03/24/2015 12:23 PM   Modules accepted: Level of Service

## 2015-03-26 ENCOUNTER — Telehealth: Payer: Self-pay | Admitting: *Deleted

## 2015-03-26 NOTE — Telephone Encounter (Signed)
Proceed with bupropion therapy as described.

## 2015-03-26 NOTE — Telephone Encounter (Signed)
Patient seen in office on 03/24/15 by Dr. Purvis SheffieldKoneswaran.  Patient stated that he was interested in smoking cessation, Chantix would be around $300.00 & he can not afford this.  Stated he has tried patches & gum in the past without success.  Patient has Medicaid so this limits his options.  Further investigation of the [place] Medicaid formulary & conversation with his pharmacy showed that generic Bupropion (Wellbutrin) which is the same medication in [place] will be his cheapest alternative.    Bupropion 150mg  daily x 3 days, then increase to 150mg  twice a day x 7-12 weeks. Should begin therapy 1 week prior to target quit date. May consider ongoing maintenance therapy based on individual patient risk/benefit.   May use in combination with nicotine patch.

## 2015-03-27 MED ORDER — BUPROPION HCL ER (SMOKING DET) 150 MG PO TB12: ORAL_TABLET | ORAL | Status: DC

## 2015-03-27 NOTE — Telephone Encounter (Signed)
Patient notified med sent to pharmacy.  

## 2015-09-16 ENCOUNTER — Other Ambulatory Visit: Payer: Self-pay

## 2015-09-16 MED ORDER — OMEPRAZOLE 20 MG PO CPDR: 20.0000 mg | DELAYED_RELEASE_CAPSULE | Freq: Every day | ORAL | Status: DC

## 2015-10-23 ENCOUNTER — Other Ambulatory Visit: Payer: Self-pay | Admitting: *Deleted

## 2015-10-23 MED ORDER — AMLODIPINE BESYLATE 2.5 MG PO TABS: 2.5000 mg | ORAL_TABLET | Freq: Every day | ORAL | Status: DC

## 2016-04-06 ENCOUNTER — Ambulatory Visit: Payer: Medicaid Other | Admitting: Cardiovascular Disease

## 2016-04-08 ENCOUNTER — Encounter: Payer: Self-pay | Admitting: Cardiovascular Disease

## 2016-04-08 ENCOUNTER — Ambulatory Visit (INDEPENDENT_AMBULATORY_CARE_PROVIDER_SITE_OTHER): Payer: Medicaid Other | Admitting: Cardiovascular Disease

## 2016-04-08 VITALS — BP 118/77 | HR 70 | Ht 72.0 in | Wt 267.0 lb

## 2016-04-08 DIAGNOSIS — I201 Angina pectoris with documented spasm: Secondary | ICD-10-CM

## 2016-04-08 DIAGNOSIS — E785 Hyperlipidemia, unspecified: Secondary | ICD-10-CM

## 2016-04-08 DIAGNOSIS — Z716 Tobacco abuse counseling: Secondary | ICD-10-CM | POA: Diagnosis not present

## 2016-04-08 DIAGNOSIS — I252 Old myocardial infarction: Secondary | ICD-10-CM

## 2016-04-08 NOTE — Patient Instructions (Signed)
Your physician wants you to follow-up in: 1 YEAR WITH DR. KONESWARAN You will receive a reminder letter in the mail two months in advance. If you don't receive a letter, please call our office to schedule the follow-up appointment.  Your physician recommends that you continue on your current medications as directed. Please refer to the Current Medication list given to you today.  Your physician recommends that you return for lab work LIPIDS - PLEASE FAST PRIOR TO LAB WORK  Thank you for choosing [facility]!!    

## 2016-04-08 NOTE — Progress Notes (Signed)
Patient ID: Brian Deleon, male   DOB: [birth year], 35 y.o.   MRN: [id number]      SUBJECTIVE: The patient presents for routine follow-up. He has a history of coronary artery spasm and non-STEMI, as well as GERD, tobacco abuse, and anxiety. He has been doing well from a cardiac standpoint and denies chest paih, palpitations and leg swelling. He seldom has shortness of breath.  He continues to smoke 2-3 packs of cigarettes daily. He could not afford Chantix. He rarely sees his PCP.  He stopped taking Lipitor due to myalgias and joint pains.  ECG performed in the office today which I personally reviewed demonstrates normal sinus rhythm with no ischemic ST segment or T-wave abnormalities, nor any arrhythmias.  His father had an MI at age 48.   Review of Systems: As per "subjective", otherwise negative.  Allergies  Allergen Reactions  . Sulfur Other (See Comments)    Reaction unknown    Current Outpatient Prescriptions  Medication Sig Dispense Refill  . amLODipine (NORVASC) 2.5 MG tablet Take 1 tablet (2.5 mg total) by mouth daily. 30 tablet 6  . aspirin EC 81 MG EC tablet Take 1 tablet (81 mg total) by mouth daily.    Marland Kitchen atorvastatin (LIPITOR) 40 MG tablet TAKE 1 TABLET BY MOUTH EVERY EVENING AT 6PM 30 tablet 5  . buPROPion (ZYBAN) 150 MG 12 hr tablet Take one tab by mouth daily x 3 days, then increase to twice a day thereafter. 60 tablet 3  . nitroGLYCERIN (NITROSTAT) 0.4 MG SL tablet Place 1 tablet (0.4 mg total) under the tongue every 5 (five) minutes as needed for chest pain. 25 tablet 3  . omeprazole (PRILOSEC) 20 MG capsule Take 1 capsule (20 mg total) by mouth daily. 30 capsule 6   No current facility-administered medications for this visit.    Past Medical History  Diagnosis Date  . GERD (gastroesophageal reflux disease)   . Hyperlipidemia   . Tobacco abuse   . Non-ST elevation MI (NSTEMI) (HCC)     Question coronary vasospasm with angiographically NL cors; EF 55-65%,  question tiny inferobasal HK, cardiac catheterization, 10/2012    Past Surgical History  Procedure Laterality Date  . Cardiac catheterization  10/22/2012    angiographically normal coronaries; LVEF 55-65%; questionable inferobasal HK  . Left heart catheterization with coronary angiogram N/A 10/22/2012    Procedure: LEFT HEART CATHETERIZATION WITH CORONARY ANGIOGRAM;  Surgeon: Herby Abraham, MD;  Location: [facility] CATH LAB;  Service: Cardiovascular;  Laterality: N/A;    Social History   Social History  . Marital Status: Single    Spouse Name: N/A  . Number of Children: N/A  . Years of Education: N/A   Occupational History  . Mechanic    Social History Main Topics  . Smoking status: Current Every Day Smoker -- 0.50 packs/day for 15 years    Types: Cigarettes    Start date: 06/12/1998  . Smokeless tobacco: Never Used  . Alcohol Use: No  . Drug Use: No  . Sexual Activity: Yes   Other Topics Concern  . Not on file   Social History Narrative   Works as a Engineer, materials Vitals:   04/08/16 1044  Height: 6' (1.829 m)  Weight: 267 lb (121.11 kg)    PHYSICAL EXAM General: NAD HEENT: Poor dentition Neck: No JVD, no thyromegaly. Lungs: Clear to auscultation bilaterally with normal respiratory effort. CV: Nondisplaced PMI.  Regular rate and rhythm, normal S1/S2, no  S3/S4, no murmur. No pretibial or periankle edema.     Abdomen: Obese.  Neurologic: Alert and oriented.  Psych: Normal affect. Skin: Normal. Musculoskeletal: No gross deformities.    ECG: Most recent ECG reviewed.      ASSESSMENT AND PLAN: 1. Coronary spasm with h/o NSTEMI: Symptomatically stable. Continue ASA and low-dose amlodipine. Stopped Lipitor 40 mg daily on his own due to myalgias and joint pains.  2. Tobacco abuse: Cessation counseling given. Could not afford Chantix and did not feel nicotine patches helped in past.  3. Hyperlipidemia: Will check lipids.  Dispo: f/u 1 year.   Prentice DockerSuresh  Yazleemar Strassner, M.D., F.A.C.C.

## 2016-04-19 ENCOUNTER — Telehealth: Payer: Self-pay | Admitting: *Deleted

## 2016-04-19 MED ORDER — SIMVASTATIN 20 MG PO TABS: 20.0000 mg | ORAL_TABLET | Freq: Every day | ORAL | Status: DC

## 2016-04-19 NOTE — Telephone Encounter (Signed)
Notes Recorded by Lesle ChrisAngela G Tenishia Ekman, LPN on 1/61/09606/13/2017 at 10:17 AM Patient notified and verbalized understanding. Copy to pmd. [facility]. Will put note for patient to pick up after Saturday as he is out of town currently. Will also send diet info at request of patient. Informed to call office if having any problems with new medication.  Notes Recorded by Lesle ChrisAngela G Lacee Grey, LPN on 4/5/40986/07/2016 at 5:51 PM No answer.  Notes Recorded by Laqueta LindenSuresh A Koneswaran, MD on 04/08/2016 at 4:31 PM LDL elevated. Would try simvastatin 20 mg daily.

## 2016-05-02 ENCOUNTER — Telehealth: Payer: Self-pay | Admitting: Cardiovascular Disease

## 2016-05-02 NOTE — Telephone Encounter (Signed)
That would be fine 

## 2016-05-02 NOTE — Telephone Encounter (Signed)
Patient wants to know if he can have repeat lipid/liver labs in 6 months since his pharmacist recommended he do this after starting the new statin.

## 2016-05-02 NOTE — Telephone Encounter (Signed)
Brian Deleon called stating [facility]. Was told by the pharmacy that he would need to have blood work done in 6 months. Brian Deleon Wants to know if we are suppose to order the blood work.

## 2016-05-02 NOTE — Telephone Encounter (Signed)
Attempted to reach patient to inform him to disregard the message on his rx bottle for simvastatin since he recently did lab work.

## 2016-05-02 NOTE — Telephone Encounter (Signed)
Patient informed and advised that he would be contacted in about 4 months with a reminder to set up fasting lab work. Patient verbalized understanding of plan.

## 2016-06-20 ENCOUNTER — Other Ambulatory Visit: Payer: Self-pay | Admitting: Cardiology

## 2016-10-27 ENCOUNTER — Other Ambulatory Visit: Payer: Self-pay | Admitting: Cardiovascular Disease

## 2016-11-03 ENCOUNTER — Ambulatory Visit: Payer: Medicaid Other | Admitting: Cardiovascular Disease

## 2016-11-25 ENCOUNTER — Encounter: Payer: Self-pay | Admitting: *Deleted

## 2016-11-28 ENCOUNTER — Ambulatory Visit: Payer: Medicaid Other | Admitting: Cardiovascular Disease

## 2016-12-09 ENCOUNTER — Ambulatory Visit: Payer: Medicaid Other | Admitting: Cardiovascular Disease

## 2016-12-23 ENCOUNTER — Ambulatory Visit: Payer: Medicaid Other | Admitting: Cardiovascular Disease

## 2016-12-23 ENCOUNTER — Encounter: Payer: Self-pay | Admitting: Cardiovascular Disease

## 2017-03-23 ENCOUNTER — Other Ambulatory Visit: Payer: Self-pay | Admitting: Cardiovascular Disease

## 2017-06-24 ENCOUNTER — Other Ambulatory Visit: Payer: Self-pay | Admitting: Cardiovascular Disease

## 2017-11-14 ENCOUNTER — Other Ambulatory Visit: Payer: Self-pay | Admitting: Cardiovascular Disease

## 2017-12-14 ENCOUNTER — Other Ambulatory Visit: Payer: Self-pay | Admitting: Cardiovascular Disease

## 2017-12-16 ENCOUNTER — Other Ambulatory Visit: Payer: Self-pay | Admitting: Cardiovascular Disease

## 2018-01-11 ENCOUNTER — Other Ambulatory Visit: Payer: Self-pay | Admitting: Cardiovascular Disease

## 2018-01-15 ENCOUNTER — Other Ambulatory Visit: Payer: Self-pay | Admitting: Cardiovascular Disease

## 2018-01-22 ENCOUNTER — Other Ambulatory Visit: Payer: Self-pay | Admitting: *Deleted

## 2018-01-22 MED ORDER — OMEPRAZOLE 20 MG PO CPDR: 20.0000 mg | DELAYED_RELEASE_CAPSULE | Freq: Every day | ORAL | 0 refills | Status: DC

## 2018-01-22 MED ORDER — AMLODIPINE BESYLATE 2.5 MG PO TABS: 2.5000 mg | ORAL_TABLET | Freq: Every day | ORAL | 0 refills | Status: DC

## 2018-01-22 MED ORDER — SIMVASTATIN 20 MG PO TABS: 20.0000 mg | ORAL_TABLET | Freq: Every day | ORAL | 0 refills | Status: DC

## 2018-01-29 ENCOUNTER — Encounter: Payer: Self-pay | Admitting: Cardiovascular Disease

## 2018-01-29 ENCOUNTER — Ambulatory Visit (INDEPENDENT_AMBULATORY_CARE_PROVIDER_SITE_OTHER): Payer: Medicaid Other | Admitting: Cardiovascular Disease

## 2018-01-29 VITALS — BP 132/78 | HR 88 | Ht 72.0 in | Wt 278.8 lb

## 2018-01-29 DIAGNOSIS — E785 Hyperlipidemia, unspecified: Secondary | ICD-10-CM | POA: Diagnosis not present

## 2018-01-29 DIAGNOSIS — I252 Old myocardial infarction: Secondary | ICD-10-CM | POA: Diagnosis not present

## 2018-01-29 DIAGNOSIS — Z720 Tobacco use: Secondary | ICD-10-CM | POA: Diagnosis not present

## 2018-01-29 DIAGNOSIS — I201 Angina pectoris with documented spasm: Secondary | ICD-10-CM | POA: Diagnosis not present

## 2018-01-29 MED ORDER — AMLODIPINE BESYLATE 2.5 MG PO TABS: 2.5000 mg | ORAL_TABLET | Freq: Every day | ORAL | 3 refills | Status: DC

## 2018-01-29 MED ORDER — OMEPRAZOLE 20 MG PO CPDR: 20.0000 mg | DELAYED_RELEASE_CAPSULE | Freq: Every day | ORAL | 3 refills | Status: AC

## 2018-01-29 MED ORDER — SIMVASTATIN 20 MG PO TABS: 20.0000 mg | ORAL_TABLET | Freq: Every day | ORAL | 3 refills | Status: DC

## 2018-01-29 NOTE — Patient Instructions (Addendum)
Medication Instructions:  Continue all current medications.  Labwork:  Lipids - order given today.  Office will contact with results via phone or letter.    Testing/Procedures: none  Follow-Up: Your physician wants you to follow up in:  1 year.  You will receive a reminder letter in the mail one-two months in advance.  If you don't receive a letter, please call our office to schedule the follow up appointment   Any Other Special Instructions Will Be Listed Below (If Applicable).  If you need a refill on your cardiac medications before your next appointment, please call your pharmacy.

## 2018-01-29 NOTE — Progress Notes (Signed)
SUBJECTIVE: The patient presents for past due follow-up.  I last evaluated him in June 2017. He has a history of coronary artery spasm and non-STEMI, as well as GERD, tobacco abuse, and anxiety. His father had an MI at age 38.  I reviewed the ECG performed today which demonstrated sinus rhythm with mild nonspecific ST segment abnormalities.  He denies chest pain and palpitations as well as leg swelling, orthopnea, and paroxysmal nocturnal dyspnea.  He seldom gets shortness of breath.  He is not taking simvastatin.  He does not follow with a PCP.  He continues to smoke about 2 packs of cigarettes daily.  He has a herniated disc in the lumbar region and has bilateral leg cramps.   Review of Systems: As per "subjective", otherwise negative.  Allergies  Allergen Reactions  . Sulfur Other (See Comments)    Reaction unknown    Current Outpatient Medications  Medication Sig Dispense Refill  . amLODipine (NORVASC) 2.5 MG tablet Take 1 tablet (2.5 mg total) by mouth daily. 30 tablet 0  . aspirin EC 81 MG EC tablet Take 1 tablet (81 mg total) by mouth daily.    . nitroGLYCERIN (NITROSTAT) 0.4 MG SL tablet Place 1 tablet (0.4 mg total) under the tongue every 5 (five) minutes as needed for chest pain. 25 tablet 3  . omeprazole (PRILOSEC) 20 MG capsule Take 1 capsule (20 mg total) by mouth daily. 30 capsule 0  . simvastatin (ZOCOR) 20 MG tablet Take 1 tablet (20 mg total) by mouth daily. 30 tablet 0   No current facility-administered medications for this visit.     Past Medical History:  Diagnosis Date  . GERD (gastroesophageal reflux disease)   . Hyperlipidemia   . Non-ST elevation MI (NSTEMI) (HCC)    Question coronary vasospasm with angiographically NL cors; EF 55-65%, question tiny inferobasal HK, cardiac catheterization, 10/2012  . Tobacco abuse     Past Surgical History:  Procedure Laterality Date  . CARDIAC CATHETERIZATION  10/22/2012   angiographically normal coronaries;  LVEF 55-65%; questionable inferobasal HK  . LEFT HEART CATHETERIZATION WITH CORONARY ANGIOGRAM N/A 10/22/2012   Procedure: LEFT HEART CATHETERIZATION WITH CORONARY ANGIOGRAM;  Surgeon: Herby Abraham, MD;  Location: [facility] CATH LAB;  Service: Cardiovascular;  Laterality: N/A;    Social History   Socioeconomic History  . Marital status: Single    Spouse name: Not on file  . Number of children: Not on file  . Years of education: Not on file  . Highest education level: Not on file  Occupational History  . Occupation: Research scientist (physical sciences)  . Financial resource strain: Not on file  . Food insecurity:    Worry: Not on file    Inability: Not on file  . Transportation needs:    Medical: Not on file    Non-medical: Not on file  Tobacco Use  . Smoking status: Current Every Day Smoker    Packs/day: 0.50    Years: 15.00    Pack years: 7.50    Types: Cigarettes    Start date: 06/12/1998  . Smokeless tobacco: Never Used  Substance and Sexual Activity  . Alcohol use: No    Alcohol/week: 0.0 oz  . Drug use: No  . Sexual activity: Yes  Lifestyle  . Physical activity:    Days per week: Not on file    Minutes per session: Not on file  . Stress: Not on file  Relationships  . Social connections:  Talks on phone: Not on file    Gets together: Not on file    Attends religious service: Not on file    Active member of club or organization: Not on file    Attends meetings of clubs or organizations: Not on file    Relationship status: Not on file  . Intimate partner violence:    Fear of current or ex partner: Not on file    Emotionally abused: Not on file    Physically abused: Not on file    Forced sexual activity: Not on file  Other Topics Concern  . Not on file  Social History Narrative   Works as a Curatormechanic     Vitals:   01/29/18 1557  BP: 132/78  Pulse: 88  Weight: 278 lb 12.8 oz (126.5 kg)  Height: 6' (1.829 m)    Wt Readings from Last 3 Encounters:  01/29/18 278 lb  12.8 oz (126.5 kg)  04/08/16 267 lb (121.1 kg)  03/24/15 276 lb (125.2 kg)     PHYSICAL EXAM General: NAD HEENT: Normal. Neck: No JVD, no thyromegaly. Lungs: Clear to auscultation bilaterally with normal respiratory effort. CV: Regular rate and rhythm, normal S1/S2, no S3/S4, no murmur. No pretibial or periankle edema.  No carotid bruit.   Abdomen: Soft, nontender, no distention.  Neurologic: Alert and oriented.  Psych: Normal affect. Skin: Normal. Musculoskeletal: No gross deformities.    ECG: Most recent ECG reviewed.   Labs: Lab Results  Component Value Date/Time   K 4.3 10/23/2012 04:25 AM   BUN 12 10/23/2012 04:25 AM   CREATININE 1.11 10/23/2012 04:25 AM   TSH 0.841 10/21/2012 05:29 PM   HGB 14.7 10/23/2012 04:25 AM     Lipids: Lab Results  Component Value Date/Time   LDLCALC 118 (H) 10/22/2012 04:40 AM   CHOL 177 10/22/2012 04:40 AM   TRIG 154 (H) 10/22/2012 04:40 AM   HDL 28 (L) 10/22/2012 04:40 AM       ASSESSMENT AND PLAN:  1. Coronary spasm with h/o NSTEMI: Symptomatically stable. Continue ASA and low-dose amlodipine. Previously stopped Lipitor 40 mg daily on his own due to myalgias and joint pains.  He is not taking simvastatin.  2. Tobacco abuse: Cessation counseling previously given. Could not afford Chantix and did not feel nicotine patches helped in past.  He continues to smoke 2 packs cigarettes daily.  3. Hyperlipidemia: I wiill check lipids.  He is not taking simvastatin.    Disposition: Follow up 1 year   Prentice DockerSuresh Neita Landrigan, M.D., F.A.C.C.

## 2018-07-31 ENCOUNTER — Other Ambulatory Visit: Payer: Self-pay | Admitting: Cardiovascular Disease

## 2019-02-12 ENCOUNTER — Telehealth: Payer: Self-pay | Admitting: *Deleted

## 2019-02-12 NOTE — Telephone Encounter (Signed)
   Primary Cardiologist:  Prentice Docker, MD   Patient contacted.  History reviewed.  No symptoms to suggest any unstable cardiac conditions.  Based on discussion, with current pandemic situation, we will be postponing this appointment for Brian Deleon with a plan for f/u in July 2020 or sooner if feasible/necessary.  If symptoms change, he has been instructed to contact our office.    Thalia Bloodgood, RN  02/12/2019 4:33 PM         .

## 2019-02-18 ENCOUNTER — Ambulatory Visit: Payer: Medicaid Other | Admitting: Cardiovascular Disease

## 2019-04-17 ENCOUNTER — Telehealth: Payer: Self-pay | Admitting: Cardiovascular Disease

## 2019-04-17 MED ORDER — NITROGLYCERIN 0.4 MG SL SUBL: 0.4000 mg | SUBLINGUAL_TABLET | SUBLINGUAL | 3 refills | Status: AC | PRN

## 2019-04-17 NOTE — Telephone Encounter (Signed)
Needing refill on nitroGLYCERIN (NITROSTAT) 0.4 MG SL tablet [[id number]]  [facility]

## 2019-04-17 NOTE — Telephone Encounter (Signed)
Done

## 2019-05-01 ENCOUNTER — Telehealth: Payer: Self-pay | Admitting: *Deleted

## 2019-05-01 NOTE — Telephone Encounter (Signed)
Pt was offered telehealth appt with Dr Bronson Ing 05/15/2019 - pt refused and also refused to reschedule an office visit (next available in September) aware that he could call back to reschedule - pt says he wasn't having any symptoms

## 2019-05-15 ENCOUNTER — Ambulatory Visit: Payer: Medicaid Other | Admitting: Cardiovascular Disease

## 2019-07-06 ENCOUNTER — Other Ambulatory Visit: Payer: Self-pay | Admitting: Cardiovascular Disease

## 2019-09-28 ENCOUNTER — Other Ambulatory Visit: Payer: Self-pay | Admitting: Cardiovascular Disease

## 2019-10-08 ENCOUNTER — Ambulatory Visit: Payer: Medicaid Other | Admitting: Cardiovascular Disease

## 2020-06-01 ENCOUNTER — Encounter: Payer: Self-pay | Admitting: *Deleted

## 2020-06-02 ENCOUNTER — Ambulatory Visit: Payer: Medicaid Other | Admitting: Family Medicine

## 2020-06-02 NOTE — Progress Notes (Deleted)
Cardiology Office Note  Date: 06/02/2020   ID: Brian Deleon, DOB 1981-01-11, MRN [id number]  PCP:  Toma Deiters, MD  Cardiologist:  No primary care provider on file. Electrophysiologist:  None   Chief Complaint: Follow-up history of NSTEMI, HLD, coronary artery spasm, tobacco abuse  History of Present Illness: Brian Deleon is a 39 y.o. male with a history of NSTEMI, HLD, coronary artery spasm, tobacco abuse.  Last saw Dr. Purvis Sheffield 01/29/2018.  Presenting for progress to follow-up.  History of coronary artery spasm and non-STEMI.  GERD, tobacco abuse, anxiety.  He denies any chest pain or palpitations.  No lower extremity edema, PND, orthopnea.  Was not taking his simvastatin.  He continues to smoke 2 packs of cigarettes per day.  History of herniated disc in the lumbar region and bilateral leg cramps.  He was symptomatically stable related to coronary artery spasms.  Continued on aspirin and low-dose amlodipine.  Had previously stopped Lipitor due to myalgia and joint pains.  He continues to smoke.  Follow-up lipid panel was ordered.  He was not taking simvastatin.     Past Medical History:  Diagnosis Date  . GERD (gastroesophageal reflux disease)   . Hyperlipidemia   . Non-ST elevation MI (NSTEMI) (HCC)    Question coronary vasospasm with angiographically NL cors; EF 55-65%, question tiny inferobasal HK, cardiac catheterization, 10/2012  . Tobacco abuse     Past Surgical History:  Procedure Laterality Date  . CARDIAC CATHETERIZATION  10/22/2012   angiographically normal coronaries; LVEF 55-65%; questionable inferobasal HK  . LEFT HEART CATHETERIZATION WITH CORONARY ANGIOGRAM N/A 10/22/2012   Procedure: LEFT HEART CATHETERIZATION WITH CORONARY ANGIOGRAM;  Surgeon: Herby Abraham, MD;  Location: [facility] CATH LAB;  Service: Cardiovascular;  Laterality: N/A;    Current Outpatient Medications  Medication Sig Dispense Refill  . amLODipine (NORVASC) 2.5 MG tablet Take 1  tablet (2.5 mg total) by mouth daily. 90 tablet 3  . amLODipine (NORVASC) 2.5 MG tablet TAKE 1 TABLET BY MOUTH EVERY DAY 90 tablet 0  . aspirin EC 81 MG EC tablet Take 1 tablet (81 mg total) by mouth daily.    . nitroGLYCERIN (NITROSTAT) 0.4 MG SL tablet Place 1 tablet (0.4 mg total) under the tongue every 5 (five) minutes as needed for chest pain. 25 tablet 3  . omeprazole (PRILOSEC) 20 MG capsule Take 1 capsule (20 mg total) by mouth daily. 90 capsule 3  . simvastatin (ZOCOR) 20 MG tablet Take 1 tablet (20 mg total) by mouth daily. 90 tablet 3   No current facility-administered medications for this visit.   Allergies:  Sulfur   Social History: The patient  reports that he has been smoking cigarettes. He started smoking about 21 years ago. He has a 7.50 pack-year smoking history. He has never used smokeless tobacco. He reports that he does not drink alcohol and does not use drugs.   Family History: The patient's family history includes Heart attack in his father; Hypertension in his father.   ROS:  Please see the history of present illness. Otherwise, complete review of systems is positive for {NONE DEFAULTED:18576::"none"}.  All other systems are reviewed and negative.   Physical Exam: VS:  There were no vitals taken for this visit., BMI There is no height or weight on file to calculate BMI.  Wt Readings from Last 3 Encounters:  01/29/18 278 lb 12.8 oz (126.5 kg)  04/08/16 267 lb (121.1 kg)  03/24/15 276 lb (125.2 kg)  General: Patient appears comfortable at rest. HEENT: Conjunctiva and lids normal, oropharynx clear with moist mucosa. Neck: Supple, no elevated JVP or carotid bruits, no thyromegaly. Lungs: Clear to auscultation, nonlabored breathing at rest. Cardiac: Regular rate and rhythm, no S3 or significant systolic murmur, no pericardial rub. Abdomen: Soft, nontender, no hepatomegaly, bowel sounds present, no guarding or rebound. Extremities: No pitting edema, distal pulses  2+. Skin: Warm and dry. Musculoskeletal: No kyphosis. Neuropsychiatric: Alert and oriented x3, affect grossly appropriate.  ECG:  {EKG/Telemetry Strips Reviewed:[id number]}  Recent Labwork: No results found for requested labs within last 8760 hours.     Component Value Date/Time   CHOL 177 10/22/2012 0440   TRIG 154 (H) 10/22/2012 0440   HDL 28 (L) 10/22/2012 0440   CHOLHDL 6.3 10/22/2012 0440   VLDL 31 10/22/2012 0440   LDLCALC 118 (H) 10/22/2012 0440    Other Studies Reviewed Today:   Assessment and Plan:  1. Coronary artery spasm (HCC)   2. Tobacco abuse   3. Mixed hyperlipidemia      Medication Adjustments/Labs and Tests Ordered: Current medicines are reviewed at length with the patient today.  Concerns regarding medicines are outlined above.   Disposition: Follow-up with ***  Signed, Rennis Harding, NP 06/02/2020 6:19 AM    [facility] [address] [place], [place], [place] [address] Phone: [phone]; Fax: [phone]

## 2020-06-12 ENCOUNTER — Ambulatory Visit: Payer: Medicaid Other | Admitting: Family Medicine

## 2020-06-12 NOTE — Progress Notes (Deleted)
Cardiology Office Note  Date: 06/12/2020   ID: Brian Deleon, DOB [birth year], MRN [id number]  PCP:  Toma Deiters, MD  Cardiologist:  No primary care provider on file. Electrophysiologist:  None   Chief Complaint: Follow-up history of NSTEMI, HLD, coronary artery spasm, tobacco abuse  History of Present Illness: Brian Deleon is a 39 y.o. male with a history of NSTEMI, HLD, coronary artery spasm, tobacco abuse.  Last saw Dr. Purvis Sheffield 01/29/2018.  Presenting for progress to follow-up.  History of coronary artery spasm and non-STEMI.  GERD, tobacco abuse, anxiety.  He denies any chest pain or palpitations.  No lower extremity edema, PND, orthopnea.  Was not taking his simvastatin.  He continues to smoke 2 packs of cigarettes per day.  History of herniated disc in the lumbar region and bilateral leg cramps.  He was symptomatically stable related to coronary artery spasms.  Continued on aspirin and low-dose amlodipine.  Had previously stopped Lipitor due to myalgia and joint pains.  He continues to smoke.  Follow-up lipid panel was ordered.  He was not taking simvastatin.     Past Medical History:  Diagnosis Date  . GERD (gastroesophageal reflux disease)   . Hyperlipidemia   . Non-ST elevation MI (NSTEMI) (HCC)    Question coronary vasospasm with angiographically NL cors; EF 55-65%, question tiny inferobasal HK, cardiac catheterization, 10/2012  . Tobacco abuse     Past Surgical History:  Procedure Laterality Date  . CARDIAC CATHETERIZATION  10/22/2012   angiographically normal coronaries; LVEF 55-65%; questionable inferobasal HK  . LEFT HEART CATHETERIZATION WITH CORONARY ANGIOGRAM N/A 10/22/2012   Procedure: LEFT HEART CATHETERIZATION WITH CORONARY ANGIOGRAM;  Surgeon: Herby Abraham, MD;  Location: [facility] CATH LAB;  Service: Cardiovascular;  Laterality: N/A;    Current Outpatient Medications  Medication Sig Dispense Refill  . amLODipine (NORVASC) 2.5 MG tablet Take 1  tablet (2.5 mg total) by mouth daily. 90 tablet 3  . amLODipine (NORVASC) 2.5 MG tablet TAKE 1 TABLET BY MOUTH EVERY DAY 90 tablet 0  . aspirin EC 81 MG EC tablet Take 1 tablet (81 mg total) by mouth daily.    . nitroGLYCERIN (NITROSTAT) 0.4 MG SL tablet Place 1 tablet (0.4 mg total) under the tongue every 5 (five) minutes as needed for chest pain. 25 tablet 3  . omeprazole (PRILOSEC) 20 MG capsule Take 1 capsule (20 mg total) by mouth daily. 90 capsule 3  . simvastatin (ZOCOR) 20 MG tablet Take 1 tablet (20 mg total) by mouth daily. 90 tablet 3   No current facility-administered medications for this visit.   Allergies:  Sulfur   Social History: The patient  reports that he has been smoking cigarettes. He started smoking about 22 years ago. He has a 7.50 pack-year smoking history. He has never used smokeless tobacco. He reports that he does not drink alcohol and does not use drugs.   Family History: The patient's family history includes Heart attack in his father; Hypertension in his father.   ROS:  Please see the history of present illness. Otherwise, complete review of systems is positive for {NONE DEFAULTED:18576::"none"}.  All other systems are reviewed and negative.   Physical Exam: VS:  There were no vitals taken for this visit., BMI There is no height or weight on file to calculate BMI.  Wt Readings from Last 3 Encounters:  01/29/18 278 lb 12.8 oz (126.5 kg)  04/08/16 267 lb (121.1 kg)  03/24/15 276 lb (125.2 kg)  General: Patient appears comfortable at rest. HEENT: Conjunctiva and lids normal, oropharynx clear with moist mucosa. Neck: Supple, no elevated JVP or carotid bruits, no thyromegaly. Lungs: Clear to auscultation, nonlabored breathing at rest. Cardiac: Regular rate and rhythm, no S3 or significant systolic murmur, no pericardial rub. Abdomen: Soft, nontender, no hepatomegaly, bowel sounds present, no guarding or rebound. Extremities: No pitting edema, distal pulses  2+. Skin: Warm and dry. Musculoskeletal: No kyphosis. Neuropsychiatric: Alert and oriented x3, affect grossly appropriate.  ECG:  {EKG/Telemetry Strips Reviewed:[phone]}  Recent Labwork: No results found for requested labs within last 8760 hours.     Component Value Date/Time   CHOL 177 10/22/2012 0440   TRIG 154 (H) 10/22/2012 0440   HDL 28 (L) 10/22/2012 0440   CHOLHDL 6.3 10/22/2012 0440   VLDL 31 10/22/2012 0440   LDLCALC 118 (H) 10/22/2012 0440    Other Studies Reviewed Today: Study Conclusions   Left ventricle: The cavity size was mildly dilated. Wall  thickness was normal. Systolic function was normal. The  estimated ejection fraction was in the range of 55% to 60%.       Transthoracic echocardiography. M-mode,  complete 2D, spectral Doppler, and color Doppler. Height:  Height: 182.9cm. Height: 72in. Weight: Weight: 121.8kg.  Weight: 268lb. Body mass index: BMI: 36.4kg/m^2. Body  surface area:  BSA: 2.79m^2. Blood pressure:   129/76.  Patient status: Inpatient. Location: Echo laboratory.   ------------------------------------------------------------   ------------------------------------------------------------  Left ventricle: The cavity size was mildly dilated. Wall  thickness was normal. Systolic function was normal. The  estimated ejection fraction was in the range of 55% to 60%.    ------------------------------------------------------------  Aortic valve:  Structurally normal valve.  Cusp separation  was normal. Doppler: Transvalvular velocity was within the  normal range. There was no stenosis. No regurgitation.   ------------------------------------------------------------  Aorta: Aortic root: The aortic root was normal in size.  Ascending aorta: The ascending aorta was normal in size.   ------------------------------------------------------------  Mitral valve:  Structurally normal valve.  Leaflet  separation was  normal. Doppler: Transvalvular velocity was  within the normal range. There was no evidence for stenosis.  No regurgitation.   ------------------------------------------------------------  Left atrium: The atrium was normal in size.   ------------------------------------------------------------  Right ventricle: The cavity size was normal. Wall thickness  was normal. Systolic function was normal.   ------------------------------------------------------------  Pulmonic valve:  Doppler:  No significant regurgitation.    ------------------------------------------------------------  Tricuspid valve:  Doppler:  No significant regurgitation.    ------------------------------------------------------------  Right atrium: The atrium was normal in size.   ------------------------------------------------------------  Pericardium: There was no pericardial effusion.   ------------------------------------------------------------  Systemic veins:  Inferior vena cava: The vessel was normal in size; the  respirophasic diameter changes were in the normal range (=  50%); findings are consistent with normal central venous  pressure.    Assessment and Plan:  1. NSTEMI (non-ST elevated myocardial infarction) (HCC)   2. Tobacco abuse   3. Mixed hyperlipidemia    1. NSTEMI (non-ST elevated myocardial infarction) (HCC) ***  2. Tobacco abuse ***  3. Mixed hyperlipidemia *** Medication Adjustments/Labs and Tests Ordered: Current medicines are reviewed at length with the patient today.  Concerns regarding medicines are outlined above.   Disposition: Follow-up with ***  Signed, Rennis Harding, NP 06/12/2020 7:58 AM    [facility] at [facility] [address] [place], [place], [place] [address] Phone: [phone]; Fax: [phone]

## 2020-09-23 NOTE — Progress Notes (Signed)
Cardiology Clinic Note   Patient Name: Brian Deleon Date of Encounter: 09/25/2020  Primary Care Provider:  Toma Deiters, MD Primary Cardiologist:  No primary care provider on file.  Patient Profile    Brian Deleon 39 year old male presents to the clinic today for follow-up evaluation of his coronary artery disease.  Past Medical History    Past Medical History:  Diagnosis Date  . GERD (gastroesophageal reflux disease)   . Hyperlipidemia   . Non-ST elevation MI (NSTEMI) (HCC)    Question coronary vasospasm with angiographically NL cors; EF 55-65%, question tiny inferobasal HK, cardiac catheterization, 10/2012  . Tobacco abuse    Past Surgical History:  Procedure Laterality Date  . CARDIAC CATHETERIZATION  10/22/2012   angiographically normal coronaries; LVEF 55-65%; questionable inferobasal HK  . LEFT HEART CATHETERIZATION WITH CORONARY ANGIOGRAM N/A 10/22/2012   Procedure: LEFT HEART CATHETERIZATION WITH CORONARY ANGIOGRAM;  Surgeon: Herby Abraham, MD;  Location: [facility] CATH LAB;  Service: Cardiovascular;  Laterality: N/A;    Allergies  Allergies  Allergen Reactions  . Simvastatin     Muscle aches   . Sulfur Other (See Comments)    Reaction unknown    History of Present Illness    [name] has a PMH of coronary artery disease status post coronary artery spasm and NSTEMI (cardiac catheterization 12/13 showed normal coronaries LVEF 55-65 percent with questionable inferior basal HK), GERD, HLD, and tobacco abuse. He also has a family history of cardiovascular disease with a father that died of MI at age 41.  He was last seen by Dr. Purvis Sheffield 01/29/2018. His EKG showed sinus rhythm with mild nonspecific ST segment abnormalities. He denied chest pain, palpitations, lower extremity edema, orthopnea, and PND. He indicated he had seven episodes of shortness of breath. He had stopped taking his simvastatin. He was also not following with his PCP. He continued to smoke  about 2 packs of cigarettes per day. He reported herniated disks in his lower back which were contributing to bilateral leg cramps.  He presents to the clinic today for follow-up evaluation states he feels well.  He is working full-time at a [employer] in [place].  He has recently been placed on fish oil from his PCP.  He also had his labs drawn at his PCP which we will request.  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  We reviewed his previous echocardiogram and cardiac catheterization results.  He still has occasional episodes of chest irritation that may come on at rest or with exertion.  He indicates that they do not last very long and go away with rest.  He reports compliance with his medications and denies side effects.  I will have him increase the fiber in his diet, give him smoking cessation information, and have him follow-up in 1 year.  I will also give him the mindfulness stress reduction sheet.  We had a long conversation about COVID-19 and COVID-19 vaccination.  He does not wish to get vaccinated at this time however, he states he is respectful of other individuals and continues to mask in public.  Today he denies chest pain, shortness of breath, lower extremity edema, fatigue, palpitations, melena, hematuria, hemoptysis, diaphoresis, weakness, presyncope, syncope, orthopnea, and PND.   Home Medications    Prior to Admission medications   Medication Sig Start Date End Date Taking? Authorizing Provider  amLODipine (NORVASC) 2.5 MG tablet Take 1 tablet (  2.5 mg total) by mouth daily. 01/29/18   Laqueta Linden, MD  amLODipine (NORVASC) 2.5 MG tablet TAKE 1 TABLET BY MOUTH EVERY DAY 09/30/19   Laqueta Linden, MD  aspirin EC 81 MG EC tablet Take 1 tablet (81 mg total) by mouth daily. 10/23/12   Arguello, Roger A, PA-C  nitroGLYCERIN (NITROSTAT) 0.4 MG SL tablet Place 1 tablet (0.4 mg total) under the  tongue every 5 (five) minutes as needed for chest pain. 04/17/19   Laqueta Linden, MD  omeprazole (PRILOSEC) 20 MG capsule Take 1 capsule (20 mg total) by mouth daily. 01/29/18   Laqueta Linden, MD  simvastatin (ZOCOR) 20 MG tablet Take 1 tablet (20 mg total) by mouth daily. 01/29/18   Laqueta Linden, MD    Family History    Family History  Problem Relation Age of Onset  . Heart attack Father   . Hypertension Father    He indicated that the status of his father is unknown.  Social History    Social History   Socioeconomic History  . Marital status: Single    Spouse name: Not on file  . Number of children: Not on file  . Years of education: Not on file  . Highest education level: Not on file  Occupational History  . Occupation: Curator  Tobacco Use  . Smoking status: Current Every Day Smoker    Packs/day: 0.50    Years: 15.00    Pack years: 7.50    Types: Cigarettes    Start date: 06/12/1998  . Smokeless tobacco: Never Used  Substance and Sexual Activity  . Alcohol use: No    Alcohol/week: 0.0 standard drinks  . Drug use: No  . Sexual activity: Yes  Other Topics Concern  . Not on file  Social History Narrative   Works as a Cabin crew Strain:   . Difficulty of Paying Living Expenses: Not on file  Food Insecurity:   . Worried About Programme researcher, broadcasting/film/video in the Last Year: Not on file  . Ran Out of Food in the Last Year: Not on file  Transportation Needs:   . Lack of Transportation (Medical): Not on file  . Lack of Transportation (Non-Medical): Not on file  Physical Activity:   . Days of Exercise per Week: Not on file  . Minutes of Exercise per Session: Not on file  Stress:   . Feeling of Stress : Not on file  Social Connections:   . Frequency of Communication with Friends and Family: Not on file  . Frequency of Social Gatherings with Friends and Family: Not on file  . Attends Religious Services:  Not on file  . Active Member of Clubs or Organizations: Not on file  . Attends Banker Meetings: Not on file  . Marital Status: Not on file  Intimate Partner Violence:   . Fear of Current or Ex-Partner: Not on file  . Emotionally Abused: Not on file  . Physically Abused: Not on file  . Sexually Abused: Not on file     Review of Systems    General:  No chills, fever, night sweats or weight changes.  Cardiovascular:  No chest pain, dyspnea on exertion, edema, orthopnea, palpitations, paroxysmal nocturnal dyspnea. Dermatological: No rash, lesions/masses Respiratory: No cough, dyspnea Urologic: No hematuria, dysuria Abdominal:   No nausea, vomiting, diarrhea, bright red blood per rectum, melena, or hematemesis Neurologic:  No visual changes, wkns,  changes in mental status. All other systems reviewed and are otherwise negative except as noted above.  Physical Exam    VS:  BP (!) 138/98   Pulse 70   Ht 6' (1.829 m)   Wt 285 lb (129.3 kg)   SpO2 97%   BMI 38.65 kg/m  , BMI Body mass index is 38.65 kg/m. GEN: Well nourished, well developed, in no acute distress. HEENT: normal. Neck: Supple, no JVD, carotid bruits, or masses. Cardiac: RRR, no murmurs, rubs, or gallops. No clubbing, cyanosis, edema.  Radials/DP/PT 2+ and equal bilaterally.  Respiratory:  Respirations regular and unlabored, clear to auscultation bilaterally. GI: Soft, nontender, nondistended, BS + x 4. MS: no deformity or atrophy. Skin: warm and dry, no rash. Neuro:  Strength and sensation are intact. Psych: Normal affect.  Accessory Clinical Findings    Recent Labs: No results found for requested labs within last 8760 hours.   Recent Lipid Panel    Component Value Date/Time   CHOL 177 10/22/2012 0440   TRIG 154 (H) 10/22/2012 0440   HDL 28 (L) 10/22/2012 0440   CHOLHDL 6.3 10/22/2012 0440   VLDL 31 10/22/2012 0440   LDLCALC 118 (H) 10/22/2012 0440    ECG personally reviewed by me  today-none today.  Echo 12/17 Study Conclusions   Left ventricle: The cavity size was mildly dilated. Wall  thickness was normal. Systolic function was normal. The  estimated ejection fraction was in the range of 55% to 60%.   Assessment & Plan   1. History of coronary artery spasm/NSTEMI-no chest pain today. Cardiac catheterization 2013 showed clean coronary arteries with LVEF 55-65 percent with questionable inferior basal HK.   Continue amlodipine, aspirin, simvastatin, nitroglycerin Heart healthy low-sodium diet-salty 6 given Increase physical activity as tolerated  Hyperlipidemia-LDL 118 on 10/22/2012. Previously did not tolerate statin therapy. Would recommend taking statin every other day and adding ezetimibe if lipids still poorly controlled. Continue fish oil  Heart healthy low-sodium high-fiber diet Request labs from PCP  Tobacco abuse-continues to smoke around 1- 1.5 packs/day. Smoking cessation recommended. Does not wish to stop smoking at this time. Smoking cessation information given  Disposition: Follow-up in 1 year.  Thomasene Ripple. Shabnam Ladd NP-C    09/25/2020, 4:34 PM [facility] [address] Office [phone] Fax [phone]  Notice: This dictation was prepared with Dragon dictation along with smaller phrase technology. Any transcriptional errors that result from this process are unintentional and may not be corrected upon review.

## 2020-09-25 ENCOUNTER — Other Ambulatory Visit: Payer: Self-pay

## 2020-09-25 ENCOUNTER — Ambulatory Visit (INDEPENDENT_AMBULATORY_CARE_PROVIDER_SITE_OTHER): Payer: Medicaid Other | Admitting: General Practice

## 2020-09-25 ENCOUNTER — Encounter: Payer: Self-pay | Admitting: General Practice

## 2020-09-25 VITALS — BP 138/98 | HR 70 | Ht 72.0 in | Wt 285.0 lb

## 2020-09-25 DIAGNOSIS — I214 Non-ST elevation (NSTEMI) myocardial infarction: Secondary | ICD-10-CM | POA: Diagnosis not present

## 2020-09-25 DIAGNOSIS — Z720 Tobacco use: Secondary | ICD-10-CM | POA: Diagnosis not present

## 2020-09-25 DIAGNOSIS — I201 Angina pectoris with documented spasm: Secondary | ICD-10-CM

## 2020-09-25 DIAGNOSIS — E782 Mixed hyperlipidemia: Secondary | ICD-10-CM | POA: Diagnosis not present

## 2020-09-25 NOTE — Patient Instructions (Signed)
Medication Instructions:  Your physician recommends that you continue on your current medications as directed. Please refer to the Current Medication list given to you today.  *If you need a refill on your cardiac medications before your next appointment, please call your pharmacy*   Lab Work: Your physician recommends that you continue on your current medications as directed. Please refer to the Current Medication list given to you today.  If you have labs (blood work) drawn today and your tests are completely normal, you will receive your results only by:  MyChart Message (if you have MyChart) OR  A paper copy in the mail If you have any lab test that is abnormal or we need to change your treatment, we will call you to review the results.   Testing/Procedures: None today   Follow-Up: At [facility], you and your health needs are our priority.  As part of our continuing mission to provide you with exceptional heart care, we have created designated Provider Care Teams.  These Care Teams include your primary Cardiologist (physician) and Advanced Practice Providers (APPs -  Physician Assistants and Nurse Practitioners) who all work together to provide you with the care you need, when you need it.  We recommend signing up for the patient portal called "MyChart".  Sign up information is provided on this After Visit Summary.  MyChart is used to connect with patients for Virtual Visits (Telemedicine).  Patients are able to view lab/test results, encounter notes, upcoming appointments, etc.  Non-urgent messages can be sent to your provider as well.   To learn more about what you can do with MyChart, go to [email or web address].    Your next appointment:   12 month(s)  The format for your next appointment:   In Person  Provider:   M.D.   Other Instructions Follow the  "[facility]" brochure we provided for you today    Steps to Quit Smoking Smoking tobacco is the leading cause  of preventable death. It can affect almost every organ in the body. Smoking puts you and people around you at risk for many serious, long-lasting (chronic) diseases. Quitting smoking can be hard, but it is one of the best things that you can do for your health. It is never too late to quit. How do I get ready to quit? When you decide to quit smoking, make a plan to help you succeed. Before you quit:  Pick a date to quit. Set a date within the next 2 weeks to give you time to prepare.  Write down the reasons why you are quitting. Keep this list in places where you will see it often.  Tell your family, friends, and co-workers that you are quitting. Their support is important.  Talk with your doctor about the choices that may help you quit.  Find out if your health insurance will pay for these treatments.  Know the people, places, things, and activities that make you want to smoke (triggers). Avoid them. What first steps can I take to quit smoking?  Throw away all cigarettes at home, at work, and in your car.  Throw away the things that you use when you smoke, such as ashtrays and lighters.  Clean your car. Make sure to empty the ashtray.  Clean your home, including curtains and carpets. What can I do to help me quit smoking? Talk with your doctor about taking medicines and seeing a counselor at the same time. You are more likely to succeed when you do  both.  If you are pregnant or breastfeeding, talk with your doctor about counseling or other ways to quit smoking. Do not take medicine to help you quit smoking unless your doctor tells you to do so. To quit smoking: Quit right away  Quit smoking totally, instead of slowly cutting back on how much you smoke over a period of time.  Go to counseling. You are more likely to quit if you go to counseling sessions regularly. Take medicine You may take medicines to help you quit. Some medicines need a prescription, and some you can buy  over-the-counter. Some medicines may contain a drug called nicotine to replace the nicotine in cigarettes. Medicines may:  Help you to stop having the desire to smoke (cravings).  Help to stop the problems that come when you stop smoking (withdrawal symptoms). Your doctor may ask you to use:  Nicotine patches, gum, or lozenges.  Nicotine inhalers or sprays.  Non-nicotine medicine that is taken by mouth. Find resources Find resources and other ways to help you quit smoking and remain smoke-free after you quit. These resources are most helpful when you use them often. They include:  Online chats with a Veterinary surgeon.  Phone quitlines.  Printed Materials engineer.  Support groups or group counseling.  Text messaging programs.  Mobile phone apps. Use apps on your mobile phone or tablet that can help you stick to your quit plan. There are many free apps for mobile phones and tablets as well as websites. Examples include Quit Guide from the [employer] and smokefree.gov  What things can I do to make it easier to quit?   Talk to your family and friends. Ask them to support and encourage you.  Call a phone quitline (1-800-QUIT-NOW), reach out to support groups, or work with a Veterinary surgeon.  Ask people who smoke to not smoke around you.  Avoid places that make you want to smoke, such as: ? Bars. ? Parties. ? Smoke-break areas at work.  Spend time with people who do not smoke.  Lower the stress in your life. Stress can make you want to smoke. Try these things to help your stress: ? Getting regular exercise. ? Doing deep-breathing exercises. ? Doing yoga. ? Meditating. ? Doing a body scan. To do this, close your eyes, focus on one area of your body at a time from head to toe. Notice which parts of your body are tense. Try to relax the muscles in those areas. How will I feel when I quit smoking? Day 1 to 3 weeks Within the first 24 hours, you may start to have some problems that come from  quitting tobacco. These problems are very bad 2-3 days after you quit, but they do not often last for more than 2-3 weeks. You may get these symptoms:  Mood swings.  Feeling restless, nervous, angry, or annoyed.  Trouble concentrating.  Dizziness.  Strong desire for high-sugar foods and nicotine.  Weight gain.  Trouble pooping (constipation).  Feeling like you may vomit (nausea).  Coughing or a sore throat.  Changes in how the medicines that you take for other issues work in your body.  Depression.  Trouble sleeping (insomnia). Week 3 and afterward After the first 2-3 weeks of quitting, you may start to notice more positive results, such as:  Better sense of smell and taste.  Less coughing and sore throat.  Slower heart rate.  Lower blood pressure.  Clearer skin.  Better breathing.  Fewer sick days. Quitting smoking can be  hard. Do not give up if you fail the first time. Some people need to try a few times before they succeed. Do your best to stick to your quit plan, and talk with your doctor if you have any questions or concerns. Summary  Smoking tobacco is the leading cause of preventable death. Quitting smoking can be hard, but it is one of the best things that you can do for your health.  When you decide to quit smoking, make a plan to help you succeed.  Quit smoking right away, not slowly over a period of time.  When you start quitting, seek help from your doctor, family, or friends. This information is not intended to replace advice given to you by your health care provider. Make sure you discuss any questions you have with your health care provider. Document Revised: 07/19/2019 Document Reviewed: 01/12/2019 Elsevier Patient Education  2020 [employer]  Mindfulness-Based Stress Reduction Mindfulness-based stress reduction (MBSR) is a program that helps people learn to practice mindfulness. Mindfulness is the practice of intentionally paying attention  to the present moment. It can be learned and practiced through techniques such as education, breathing exercises, meditation, and yoga. MBSR includes several mindfulness techniques in one program. MBSR works best when you understand the treatment, are willing to try new things, and can commit to spending time practicing what you learn. MBSR training may include learning about:  How your emotions, thoughts, and reactions affect your body.  New ways to respond to things that cause negative thoughts to start (triggers).  How to notice your thoughts and let go of them.  Practicing awareness of everyday things that you normally do without thinking.  The techniques and goals of different types of meditation. What are the benefits of MBSR? MBSR can have many benefits, which include helping you to:  Develop self-awareness. This refers to knowing and understanding yourself.  Learn skills and attitudes that help you to participate in your own health care.  Learn new ways to care for yourself.  Be more accepting about how things are, and let things go.  Be less judgmental and approach things with an open mind.  Be patient with yourself and trust yourself more. MBSR has also been shown to:  Reduce negative emotions, such as depression and anxiety.  Improve memory and focus.  Change how you sense and approach pain.  Boost your body's ability to fight infections.  Help you connect better with other people.  Improve your sense of well-being. Follow these instructions at home:   Find a local in-person or online MBSR program.  Set aside some time regularly for mindfulness practice.  Find a mindfulness practice that works best for you. This may include one or more of the following: ? Meditation. Meditation involves focusing your mind on a certain thought or activity. ? Breathing awareness exercises. These help you to stay present by focusing on your breath. ? Body scan. For this  practice, you lie down and pay attention to each part of your body from head to toe. You can identify tension and soreness and intentionally relax parts of your body. ? Yoga. Yoga involves stretching and breathing, and it can improve your ability to move and be flexible. It can also provide an experience of testing your body's limits, which can help you release stress. ? Mindful eating. This way of eating involves focusing on the taste, texture, color, and smell of each bite of food. Because this slows down eating and helps you feel  full sooner, it can be an important part of a weight-loss plan.  Find a podcast or recording that provides guidance for breathing awareness, body scan, or meditation exercises. You can listen to these any time when you have a free moment to rest without distractions.  Follow your treatment plan as told by your health care provider. This may include taking regular medicines and making changes to your diet or lifestyle as recommended. How to practice mindfulness To do a basic awareness exercise:  Find a comfortable place to sit.  Pay attention to the present moment. Observe your thoughts, feelings, and surroundings just as they are.  Avoid placing judgment on yourself, your feelings, or your surroundings. Make note of any judgment that comes up, and let it go.  Your mind may wander, and that is okay. Make note of when your thoughts drift, and return your attention to the present moment. To do basic mindfulness meditation:  Find a comfortable place to sit. This may include a stable chair or a firm floor cushion. ? Sit upright with your back straight. Let your arms fall next to your side with your hands resting on your legs. ? If sitting in a chair, rest your feet flat on the floor. ? If sitting on a cushion, cross your legs in front of you.  Keep your head in a neutral position with your chin dropped slightly. Relax your jaw and rest the tip of your tongue on the  roof of your mouth. Drop your gaze to the floor. You can close your eyes if you like.  Breathe normally and pay attention to your breath. Feel the air moving in and out of your nose. Feel your belly expanding and relaxing with each breath.  Your mind may wander, and that is okay. Make note of when your thoughts drift, and return your attention to your breath.  Avoid placing judgment on yourself, your feelings, or your surroundings. Make note of any judgment or feelings that come up, let them go, and bring your attention back to your breath.  When you are ready, lift your gaze or open your eyes. Pay attention to how your body feels after the meditation. Where to find more information You can find more information about MBSR from:  Your health care provider.  Community-based meditation centers or programs.  Programs offered near you. Summary  Mindfulness-based stress reduction (MBSR) is a program that teaches you how to intentionally pay attention to the present moment. It is used with other treatments to help you cope better with daily stress, emotions, and pain.  MBSR focuses on developing self-awareness, which allows you to respond to life stress without judgment or negative emotions.  MBSR programs may involve learning different mindfulness practices, such as breathing exercises, meditation, yoga, body scan, or mindful eating. Find a mindfulness practice that works best for you, and set aside time for it on a regular basis. This information is not intended to replace advice given to you by your health care provider. Make sure you discuss any questions you have with your health care provider. Document Revised: 10/06/2017 Document Reviewed: 03/02/2017 Elsevier Patient Education  2020 [employer].

## 2020-10-09 ENCOUNTER — Telehealth: Payer: Self-pay

## 2020-10-09 NOTE — Telephone Encounter (Signed)
Error

## 2021-06-14 NOTE — Progress Notes (Addendum)
Cardiology Office Note  Date: 06/15/2021   ID: Brian Deleon, DOB 19-Jun-1981, MRN [id number]  PCP:  Brian Deiters, MD  Cardiologist:  None Electrophysiologist:  None   Chief Complaint: Feeling sluggish and tired for several weeks  History of Present Illness: Brian Deleon is a 40 y.o. male with a history of Coronary artery spasm, NSTEMI, HLD, GERD, Tobacco abuse.  He was last seen by Brian Deleon nurse practitioner on 09/25/2020 for follow-up of his coronary artery disease.  He reported feeling well.  He denied any chest pain, shortness of breath, palpitations, fatigue, edema, blood in stool or urine.  No diaphoresis, weakness, syncope or presyncope.  No orthopnea or PND.  He reported occasional episodes of "chest irritation" which might come on with exertion or at rest.  He mentioned they were short-lived.  He was continuing amlodipine, aspirin, simvastatin, nitroglycerin.  He was continuing to smoke 1 to 1-1/2 packs/day smoking cessation was recommended.  He was not motivated to stop smoking at that time.  Previously intolerant of statin therapy.  Brian Deleon recommended taking statin every other day and adding Zetia if lipids were still poorly controlled.  Labs were requested from PCP.  Patient was continuing to take fish oil.  He is here today with complaints of feeling sluggish and tired for several weeks.  He states his primary had referred him for evaluation for sleep study but no one has contacted him as of this date.  He admits to snoring, periods of apnea and excessive daytime sleepiness.  He is a smoker and continues to smoke.  History of NSTEMI back in 2013 with nonobstructive disease on cardiac catheterization.  EKG today shows normal sinus rhythm with rate of 78.  Blood pressures reasonably well controlled at 128/84.  He does have morbid obesity with a BMI of 40.5.  He states he recently had lab work at his primary care provider and his cholesterol was elevated but no statins  were prescribed he does take fish oil 1000 mg capsules 3 times daily.  He takes amlodipine 5 mg daily for blood pressure.  He takes aspirin 81 mg daily.  As needed sublingual nitroglycerin and omeprazole 20 mg for GERD.  He states he had a tick bite about a month ago.  He states since that time he has been feeling a little sluggish and tired.  Also complaining of problems eating red meat recently along with his GERD symptoms.  States he has been feeling some chest pressure with exertion along with some shortness of breath with exertion.   Past Medical History:  Diagnosis Date   GERD (gastroesophageal reflux disease)    Hyperlipidemia    Non-ST elevation MI (NSTEMI) (HCC)    Question coronary vasospasm with angiographically NL cors; EF 55-65%, question tiny inferobasal HK, cardiac catheterization, 10/2012   Tobacco abuse     Past Surgical History:  Procedure Laterality Date   CARDIAC CATHETERIZATION  10/22/2012   angiographically normal coronaries; LVEF 55-65%; questionable inferobasal HK   LEFT HEART CATHETERIZATION WITH CORONARY ANGIOGRAM N/A 10/22/2012   Procedure: LEFT HEART CATHETERIZATION WITH CORONARY ANGIOGRAM;  Surgeon: Brian Abraham, MD;  Location: [facility] C[facility] LAB;  Service: Cardiovascular;  Laterality: N/A;    Current Outpatient Medications  Medication Sig Dispense Refill   amLODipine (NORVASC) 5 MG tablet Take 5 mg by mouth daily.     aspirin EC 81 MG EC tablet Take 1 tablet (81 mg total) by mouth daily.     ELDERBERRY  PO Take by mouth in the morning, at noon, and at bedtime.     nitroGLYCERIN (NITROSTAT) 0.4 MG SL tablet Place 1 tablet (0.4 mg total) under the tongue every 5 (five) minutes as needed for chest pain. 25 tablet 3   Omega-3 Fatty Acids (FISH OIL) 1000 MG CAPS Take by mouth in the morning, at noon, and at bedtime.     omeprazole (PRILOSEC) 20 MG capsule Take 1 capsule (20 mg total) by mouth daily. 90 capsule 3   No current facility-administered medications for this  visit.   Allergies:  Elemental sulfur and Simvastatin   Social History: The patient  reports that he has been smoking cigarettes. He started smoking about 23 years ago. He has a 7.50 pack-year smoking history. He has never used smokeless tobacco. He reports that he does not drink alcohol and does not use drugs.   Family History: The patient's family history includes Heart attack in his father; Hypertension in his father.   ROS:  Please see the history of present illness. Otherwise, complete review of systems is positive for none.  All other systems are reviewed and negative.   Physical Exam: VS:  BP 128/84   Pulse 76   Ht 6' (1.829 m)   Wt 299 lb 3.2 oz (135.7 kg)   SpO2 98%   BMI 40.58 kg/m , BMI Body mass index is 40.58 kg/m.  Wt Readings from Last 3 Encounters:  06/15/21 299 lb 3.2 oz (135.7 kg)  09/25/20 285 lb (129.3 kg)  01/29/18 278 lb 12.8 oz (126.5 kg)    General: Morbidly obese patient appears comfortable at rest. Neck: Supple, no elevated JVP or carotid bruits, no thyromegaly. Lungs: Clear to auscultation, nonlabored breathing at rest. Cardiac: Regular rate and rhythm, no S3 or significant systolic murmur, no pericardial rub. Extremities: No pitting edema, distal pulses 2+. Skin: Warm and dry. Musculoskeletal: No kyphosis. Neuropsychiatric: Alert and oriented x3, affect grossly appropriate.  ECG: 06/15/2021 EKG normal sinus rhythm rate of 78  Recent Labwork: No results found for requested labs within last 8760 hours.     Component Value Date/Time   CHOL 177 10/22/2012 0440   TRIG 154 (H) 10/22/2012 0440   HDL 28 (L) 10/22/2012 0440   CHOLHDL 6.3 10/22/2012 0440   VLDL 31 10/22/2012 0440   LDLCALC 118 (H) 10/22/2012 0440    Other Studies Reviewed Today:  CT abdomen / pelvis UNCR 04/18/2020  1. No acute intra-abdominal process.  2. Punctate nonobstructive right nephrolithiasis.  3. Severe hepatic steatosis.  4. Calcified right inguinal lymph nodes are  nonspecific but  presumably related to prior granulomatous disease in the absence of  a history of treated lymphoma or known malignancy.  5. Aortic Atherosclerosis (ICD10-I70.0).     Echocardiogram 10/23/2012 Study Conclusions   Left ventricle: The cavity size was mildly dilated. Wall  thickness was normal. Systolic function was normal. The  estimated ejection fraction was in the range of 55% to 60%.              Transthoracic echocardiography.  M-mode,  complete 2D, spectral Doppler, and color Doppler.  Height:  Height: 182.9cm. Height: 72in.  Weight:  Weight: 121.8kg.  Weight: 268lb.  Body mass index:  BMI: 36.4kg/m^2.  Body  surface area:    BSA: 2.5968m^2.  Blood pressure:     129/76.  Patient status:  Inpatient.  Location:  Echo laboratory    Cardiac catheterization 10/22/2012 Procedural Findings: Hemodynamics: AO 106/74 (92) LV 115/18 No  gradient on pullback   Coronary angiography: Coronary dominance: right   Left mainstem: Normal with no obstruction   Left anterior descending (LAD): Courses to the apex and provides the apical tip.  Smooth throughout.  No evidence of significant obstruction   Left circumflex (LCx): Provides a smaller ramus and large marginal branch.  No significant obstruction.   Right coronary artery (RCA): Provides a PDA and PLA, both moderate in size.  No significant obstruction is noted.     Left ventriculography: Left ventricular systolic function is normal, LVEF is estimated at 55-65%, there is no significant mitral regurgitation.  There might be a tiny area of inferobasal hypokinesis, but this is not definitive.      Final Conclusions:   1.  Non STEMI by enzyme analysis 2.  No evidence of high grade obstruction.     Recommendations:  1.  DC smoking 2.  Medical therapy.   Shawnie Pons  MD, [facility], FSCAI 10/22/2012, 4:48 PM     Assessment and Plan:  1. Sluggishness   2. Tiredness   3. Coronary artery spasm (HCC)   4. Mixed  hyperlipidemia   5. Tobacco abuse   6. Tick bite of scrotum, initial encounter   7. Screening for diabetes mellitus   8. Snoring   9. Suspected sleep apnea   10. Morbid obesity (HCC)    1. Sluggishness/recent tick bite Complaining of sluggishness and tiredness over the last month or so.  He states he had a recent tick bite over a month ago.  He also has suspected sleep apnea.  Also complaining of new abdominal discomfort after eating red meat.  As noted below we will get a Lyme disease panel and [facility] spotted fever panel.  2. Tiredness/ recent tick bite Complaining of recent tiredness over the last month or so.  He has suspected sleep apnea which may be contributing to some degree.  He states he also had a tick bite recently.  He has not been checked for Lyme's disease or [facility] spotted fever.  He is unaware if he is ever been checked for hypothyroidism or vitamin deficiency.  He is unaware if he has ever been screened for diabetes.  Please get Lyme disease panel and [facility] spotted fever panel.  Please get vitamin B-12 and vitamin D levels.  Please get TSH, T3, T4 levels.  Please get hemoglobin A1c level.  3. Coronary artery spasm (HCC) History of coronary artery spasms.  History of NSTEMI back in 2013 with nonobstructive disease on cardiac catheterization.  Complaining of chest pressure with exertional effort recently.  EKG today shows normal sinus rhythm rate of 78 with no ST or T wave abnormalities.  No ectopy.  Does have a family history of early heart disease in addition to his previous MI back in 2013.  His father had early CAD.  Please schedule a Lexiscan stress test.  4. Mixed hyperlipidemia States he recently had elevated lipids at PCP office and states cholesterol was elevated but not prescribed statin medication.  He continues taking fish oil 1000 mg p.o. 3 times daily.  Please get a lipid panel.  5. Tobacco abuse He continues to smoke.  At prior visit with Mr.  Molli Deleon he was advised to stop.  He was smoking 1 to 1-1/2 packs/day at prior visit with Mr. Alben Spittle.  I highly advised him to stop smoking.  6.  Suspected sleep apnea He has symptoms of snoring, apnea/hypoapnea, daytime hypersomnolence, fatigue, sluggishness.  Please refer to  Drs. Vassie Loll or Dr. Sherene Sires for sleep consultation  7.  DOE/SOB He is complaining of increased DOE and SOB over the last few months.  He describes it as worsening recently. Please get an echocardiogram to assess LV function, diastolic function and valvular function.  8.  Morbid obesity He has a BMI of 40.  Advised weight loss.  Medication Adjustments/Labs and Tests Ordered: Current medicines are reviewed at length with the patient today.  Concerns regarding medicines are outlined above.   Disposition: Follow-up with Dr. Wyline Mood or APP 6 to 8 weeks.  Signed, Rennis Harding, NP 06/15/2021 11:38 AM    [facility] [address] [place], [place], [place] [address] Phone: [phone]; Fax: [phone]

## 2021-06-15 ENCOUNTER — Telehealth: Payer: Self-pay | Admitting: Family Medicine

## 2021-06-15 ENCOUNTER — Ambulatory Visit (INDEPENDENT_AMBULATORY_CARE_PROVIDER_SITE_OTHER): Payer: Medicaid Other | Admitting: Family Medicine

## 2021-06-15 ENCOUNTER — Encounter: Payer: Self-pay | Admitting: *Deleted

## 2021-06-15 ENCOUNTER — Encounter: Payer: Self-pay | Admitting: Family Medicine

## 2021-06-15 ENCOUNTER — Other Ambulatory Visit: Payer: Self-pay

## 2021-06-15 VITALS — BP 128/84 | HR 76 | Ht 72.0 in | Wt 299.2 lb

## 2021-06-15 DIAGNOSIS — R5383 Other fatigue: Secondary | ICD-10-CM

## 2021-06-15 DIAGNOSIS — W57XXXA Bitten or stung by nonvenomous insect and other nonvenomous arthropods, initial encounter: Secondary | ICD-10-CM

## 2021-06-15 DIAGNOSIS — E782 Mixed hyperlipidemia: Secondary | ICD-10-CM

## 2021-06-15 DIAGNOSIS — R0683 Snoring: Secondary | ICD-10-CM

## 2021-06-15 DIAGNOSIS — I201 Angina pectoris with documented spasm: Secondary | ICD-10-CM

## 2021-06-15 DIAGNOSIS — Z131 Encounter for screening for diabetes mellitus: Secondary | ICD-10-CM

## 2021-06-15 DIAGNOSIS — S30863A Insect bite (nonvenomous) of scrotum and testes, initial encounter: Secondary | ICD-10-CM

## 2021-06-15 DIAGNOSIS — Z720 Tobacco use: Secondary | ICD-10-CM

## 2021-06-15 DIAGNOSIS — R4189 Other symptoms and signs involving cognitive functions and awareness: Secondary | ICD-10-CM

## 2021-06-15 DIAGNOSIS — R29818 Other symptoms and signs involving the nervous system: Secondary | ICD-10-CM

## 2021-06-15 DIAGNOSIS — R079 Chest pain, unspecified: Secondary | ICD-10-CM

## 2021-06-15 NOTE — Telephone Encounter (Signed)
Checking percert on the following patient for testing scheduled at [facility]    LEXISCAN  06/28/2021

## 2021-06-15 NOTE — Patient Instructions (Addendum)
Medication Instructions:  Your physician recommends that you continue on your current medications as directed. Please refer to the Current Medication list given to you today.  Labwork: Your physician recommends that you return for a FASTING lipid/liver profile, RMSF, Thyroid Panel, HgA1C, B12 & Vit D. Please do not eat or drink for at least 8 hours when you have this done. You may take your medications that morning with a sip of water. Can be done at [facility]  Testing/Procedures: Your physician has requested that you have a lexiscan myoview. For further information please visit [email or web address] Please follow instruction sheet, as given.  Follow-Up: Your physician recommends that you schedule a follow-up appointment in: 6-8 weeks  Any Other Special Instructions Will Be Listed Below (If Applicable). You have been referred to [facility]  If you need a refill on your cardiac medications before your next appointment, please call your pharmacy.

## 2021-06-18 ENCOUNTER — Other Ambulatory Visit (HOSPITAL_BASED_OUTPATIENT_CLINIC_OR_DEPARTMENT_OTHER): Payer: Self-pay

## 2021-06-18 DIAGNOSIS — G4710 Hypersomnia, unspecified: Secondary | ICD-10-CM

## 2021-06-22 ENCOUNTER — Telehealth: Payer: Self-pay | Admitting: *Deleted

## 2021-06-22 DIAGNOSIS — R5383 Other fatigue: Secondary | ICD-10-CM

## 2021-06-22 DIAGNOSIS — E782 Mixed hyperlipidemia: Secondary | ICD-10-CM

## 2021-06-22 DIAGNOSIS — R4189 Other symptoms and signs involving cognitive functions and awareness: Secondary | ICD-10-CM

## 2021-06-22 DIAGNOSIS — Z79899 Other long term (current) drug therapy: Secondary | ICD-10-CM

## 2021-06-22 DIAGNOSIS — W57XXXA Bitten or stung by nonvenomous insect and other nonvenomous arthropods, initial encounter: Secondary | ICD-10-CM

## 2021-06-22 MED ORDER — ROSUVASTATIN CALCIUM 5 MG PO TABS: 5.0000 mg | ORAL_TABLET | Freq: Every day | ORAL | 3 refills | Status: DC

## 2021-06-22 NOTE — Telephone Encounter (Signed)
Patient informed and verbalized understanding of plan. Lab order sent to [facility] for Lyme Titer and FLP & LFT's in 8-12 weeks Copy sent to PCP

## 2021-06-22 NOTE — Telephone Encounter (Signed)
-----   Message from Eustace Moore, RN sent at 06/22/2021  1:57 PM EDT -----  ----- Message ----- From: Netta Neat., NP Sent: 06/22/2021   1:28 PM EDT To: Lesle Chris, LPN  Please call the patient and let him know the [facility] spotted fever test was negative.  I do not see a result for the Lyme's disease titer.  Was it ever done?  Netta Neat, NP  06/22/2021 1:26 PM

## 2021-06-22 NOTE — Telephone Encounter (Signed)
-----   Message from Eustace Moore, RN sent at 06/22/2021  7:22 AM EDT -----  ----- Message ----- From: Netta Neat., NP Sent: 06/18/2021   1:48 PM EDT To: Lesle Chris, LPN  Please call the patient and let him know his hemoglobin A1c was normal. No evidence of diabetes.  Thyroid studies showed slightly decreased thyroid-stimulating hormone but T3 and T4 were normal.  No evidence of hyper or hypothyroidism.  His cholesterol panel showed his triglycerides were slightly high with total cholesterol slightly over the normal limit at 225.  LDL was 155.  His liver function tests were normal.  We need to treat the hyperlipidemia.  It appears he is intolerant to statins.  Simvastatin is listed as an allergy.  Ask him if he is willing to try a low-dose of Crestor at 5 mg daily.  If he is agreeable go ahead and start Crestor and get FLP's and LFTs between 8 and 12 weeks after starting.  If he starts having muscle symptoms just tell him to stop and we will refer him to the lipid clinic.  His vitamin B12 was normal.    Netta Neat, NP  06/18/2021 1:41 PM

## 2021-06-28 ENCOUNTER — Ambulatory Visit (HOSPITAL_COMMUNITY)
Admission: RE | Admit: 2021-06-28 | Discharge: 2021-06-28 | Disposition: A | Discharge status: Home / Self Care | Payer: Medicaid Other | Source: Ambulatory Visit | Attending: Family Medicine | Admitting: Family Medicine

## 2021-06-28 ENCOUNTER — Other Ambulatory Visit: Payer: Self-pay

## 2021-06-28 ENCOUNTER — Encounter (HOSPITAL_COMMUNITY)
Admission: RE | Admit: 2021-06-28 | Discharge: 2021-06-28 | Disposition: A | Discharge status: Home / Self Care | Payer: Medicaid Other | Source: Ambulatory Visit | Attending: Family Medicine | Admitting: Family Medicine

## 2021-06-28 DIAGNOSIS — R079 Chest pain, unspecified: Secondary | ICD-10-CM | POA: Insufficient documentation

## 2021-06-28 LAB — NM MYOCAR MULTI W/SPECT W/WALL MOTION / EF
Base ST Depression (mm): 0 mm
LV dias vol: 120 mL (ref 62–150)
LV sys vol: 56 mL
MPHR: 179 {beats}/min
Nuc Stress EF: 53 %
Peak HR: 113 {beats}/min
Percent HR: 63.1 %
Rest HR: 65 {beats}/min
Rest Nuclear Isotope Dose: 10 mCi
ST Depression (mm): 0 mm
Stress Nuclear Isotope Dose: 30 mCi

## 2021-06-28 MED ORDER — TECHNETIUM TC 99M TETROFOSMIN IV KIT
10.0000 | PACK | Freq: Once | INTRAVENOUS | Status: AC | PRN
  Administered 2021-06-28: 9.8 via INTRAVENOUS

## 2021-06-28 MED ORDER — TECHNETIUM TC 99M TETROFOSMIN IV KIT
30.0000 | PACK | Freq: Once | INTRAVENOUS | Status: AC | PRN
  Administered 2021-06-28: 28.8 via INTRAVENOUS

## 2021-06-28 MED ORDER — SODIUM CHLORIDE FLUSH 0.9 % IV SOLN
INTRAVENOUS | Status: AC
  Administered 2021-06-28: 10 mL via INTRAVENOUS
  Filled 2021-06-28: qty 10

## 2021-06-28 MED ORDER — REGADENOSON 0.4 MG/5ML IV SOLN
INTRAVENOUS | Status: AC
  Administered 2021-06-28: 0.4 mg via INTRAVENOUS
  Filled 2021-06-28: qty 5

## 2021-06-30 ENCOUNTER — Telehealth: Payer: Self-pay | Admitting: Family Medicine

## 2021-06-30 NOTE — Telephone Encounter (Signed)
Patient calling to get stress test results

## 2021-06-30 NOTE — Telephone Encounter (Signed)
-----   Message from Lesle Chris, LPN sent at 9/37/1696  4:35 PM EDT -----  ----- Message ----- From: Netta Neat., NP Sent: 06/29/2021  12:28 AM EDT To: Lesle Chris, LPN  Please call the patient and let him know the stress test was considered a low risk study by the interpreting physician.  Netta Neat, NP  06/29/2021 12:25 AM

## 2021-06-30 NOTE — Telephone Encounter (Signed)
Patient informed. Copy sent to PCP °

## 2021-07-26 NOTE — Progress Notes (Signed)
Cardiology Office Note  Date: 07/27/2021   ID: Brian Deleon, DOB [birth year], MRN [id number]  PCP:  Brian Deiters, MD  Cardiologist:  None Electrophysiologist:  None   Chief Complaint: Feeling sluggish and tired for several weeks  History of Present Illness: Brian Deleon is a 40 y.o. male with a history of Coronary artery spasm, NSTEMI, HLD, GERD, Tobacco abuse.  He was last seen by Edd Fabian nurse practitioner on 09/25/2020 for follow-up of his coronary artery disease.  He reported feeling well.  He denied any chest pain, shortness of breath, palpitations, fatigue, edema, blood in stool or urine.  No diaphoresis, weakness, syncope or presyncope.  No orthopnea or PND.  He reported occasional episodes of "chest irritation" which might come on with exertion or at rest.  He mentioned they were short-lived.  He was continuing amlodipine, aspirin, simvastatin, nitroglycerin.  He was continuing to smoke 1 to 1-1/2 packs/day smoking cessation was recommended.  He was not motivated to stop smoking at that time.  Previously intolerant of statin therapy.  [name]  Labs were requested from PCP.  Patient was continuing to take fish oil.  He is here today with complaints of feeling sluggish and tired for several weeks.  He states his primary had referred him for evaluation for sleep study but no one has contacted him as of this date.  He admits to snoring, periods of apnea and excessive daytime sleepiness.  He is a smoker and continues to smoke.  History of NSTEMI back in 2013 with nonobstructive disease on cardiac catheterization.  EKG today shows normal sinus rhythm with rate of 78.  Blood pressures reasonably well controlled at 128/84.  He does have morbid obesity with a BMI of 40.5.  He states he recently had lab work at his primary care provider and his cholesterol was elevated but no statins  were prescribed he does take fish oil 1000 mg capsules 3 times daily.  He takes amlodipine 5 mg daily for blood pressure.  He takes aspirin 81 mg daily.  As needed sublingual nitroglycerin and omeprazole 20 mg for GERD.  He states he had a tick bite about a month ago.  He states since that time he has been feeling a little sluggish and tired.  Also complaining of problems eating red meat recently along with his GERD symptoms.  States he has been feeling some chest pressure with exertion along with some shortness of breath with exertion.   He is here for follow-up for recent stress test.  He states plans are for sleep study in the near future.  He denies any current anginal or exertional symptoms.  He does continue to smoke.  No orthostatic symptoms, CVA or TIA-like symptoms, palpitations or arrhythmias, PND, orthopnea.  Blood pressure slightly elevated today at 138/90.  He states he smoked a cigarette prior to coming to clinic today.  We reviewed the results of the stress test. Stress test on 06/28/2021 was considered low risk.  There was no evidence of inducible ischemia or infarction.  There was a small defect of mild reduction in uptake in the apical anteroseptal location that was fixed, second defect was small with mild reduction in uptake present in basal inferior location partially reversible in setting of gut uptake.  Calculated EF was 53%.  Low risk with evidence of very soft tissue attenuation.  No significant ischemia with LVEF  of 53%.    Past Medical History:  Diagnosis Date   GERD (gastroesophageal reflux disease)    Hyperlipidemia    Non-ST elevation MI (NSTEMI) (HCC)    Question coronary vasospasm with angiographically NL cors; EF 55-65%, question tiny inferobasal HK, cardiac catheterization, 10/2012   Tobacco abuse     Past Surgical History:  Procedure Laterality Date   CARDIAC CATHETERIZATION  10/22/2012   angiographically normal coronaries; LVEF 55-65%; questionable inferobasal HK    LEFT HEART CATHETERIZATION WITH CORONARY ANGIOGRAM N/A 10/22/2012   Procedure: LEFT HEART CATHETERIZATION WITH CORONARY ANGIOGRAM;  Surgeon: Herby Abraham, MD;  Location: [facility] CATH LAB;  Service: Cardiovascular;  Laterality: N/A;    Current Outpatient Medications  Medication Sig Dispense Refill   amLODipine (NORVASC) 5 MG tablet Take 5 mg by mouth daily.     aspirin EC 81 MG EC tablet Take 1 tablet (81 mg total) by mouth daily.     ELDERBERRY PO Take by mouth in the morning, at noon, and at bedtime.     nitroGLYCERIN (NITROSTAT) 0.4 MG SL tablet Place 1 tablet (0.4 mg total) under the tongue every 5 (five) minutes as needed for chest pain. 25 tablet 3   Omega-3 Fatty Acids (FISH OIL) 1000 MG CAPS Take by mouth in the morning, at noon, and at bedtime.     omeprazole (PRILOSEC) 20 MG capsule Take 1 capsule (20 mg total) by mouth daily. 90 capsule 3   rosuvastatin (CRESTOR) 5 MG tablet Take 1 tablet (5 mg total) by mouth daily. 90 tablet 3   No current facility-administered medications for this visit.   Allergies:  Elemental sulfur and Simvastatin   Social History: The patient  reports that he has been smoking cigarettes. He started smoking about 23 years ago. He has a 7.50 pack-year smoking history. He has never used smokeless tobacco. He reports that he does not drink alcohol and does not use drugs.   Family History: The patient's family history includes Heart attack in his father; Hypertension in his father.   ROS:  Please see the history of present illness. Otherwise, complete review of systems is positive for none.  All other systems are reviewed and negative.   Physical Exam: VS:  BP 138/90 (BP Location: Right Arm, Patient Position: Sitting, Cuff Size: Large)   Pulse 89   Ht 6' (1.829 m)   Wt 298 lb 3.2 oz (135.3 kg)   SpO2 97%   BMI 40.44 kg/m , BMI Body mass index is 40.44 kg/m.  Wt Readings from Last 3 Encounters:  07/27/21 298 lb 3.2 oz (135.3 kg)  06/15/21 299 lb 3.2  oz (135.7 kg)  09/25/20 285 lb (129.3 kg)    General: Morbidly obese patient appears comfortable at rest. Neck: Supple, no elevated JVP or carotid bruits, no thyromegaly. Lungs: Clear to auscultation, nonlabored breathing at rest. Cardiac: Regular rate and rhythm, no S3 or significant systolic murmur, no pericardial rub. Extremities: No pitting edema, distal pulses 2+. Skin: Warm and dry. Musculoskeletal: No kyphosis. Neuropsychiatric: Alert and oriented x3, affect grossly appropriate.  ECG: 06/15/2021 EKG normal sinus rhythm rate of 78  Recent Labwork: No results found for requested labs within last 8760 hours.     Component Value Date/Time   CHOL 177 10/22/2012 0440   TRIG 154 (H) 10/22/2012 0440   HDL 28 (L) 10/22/2012 0440   CHOLHDL 6.3 10/22/2012 0440   VLDL 31 10/22/2012 0440   LDLCALC 118 (H) 10/22/2012 0440    Other  Studies Reviewed Today:  Nuclear stress test 06/28/2021 & Impression      Findings are consistent with no significant ischemia. The study is low risk.   No ST deviation was noted.   There is no evidence of inducible ischemia and infarction.   Defect 1: There is a small defect with mild reduction in uptake present in the apical anteroseptal location(s) that is fixed. Defect 2: There is a small defect with mild reduction in uptake present in the basal inferolateral location(s) that is partially reversible in the setting of gut uptake.   Nuclear stress EF: 53 %. The left ventricular ejection fraction is normal (55-65%). Left ventricular function is normal. End diastolic cavity size is normal. End systolic cavity size is normal.   Low risk with evidence of variable soft tissue attenuation, no significant ischemia, LVEF 53%.       CT abdomen / pelvis UNCR 04/18/2020  1. No acute intra-abdominal process.  2. Punctate nonobstructive right nephrolithiasis.  3. Severe hepatic steatosis.  4. Calcified right inguinal lymph nodes are nonspecific but  presumably  related to prior granulomatous disease in the absence of  a history of treated lymphoma or known malignancy.  5. Aortic Atherosclerosis (ICD10-I70.0).     Echocardiogram 10/23/2012 Study Conclusions   Left ventricle: The cavity size was mildly dilated. Wall  thickness was normal. Systolic function was normal. The  estimated ejection fraction was in the range of 55% to 60%.              Transthoracic echocardiography.  M-mode,  complete 2D, spectral Doppler, and color Doppler.  Height:  Height: 182.9cm. Height: 72in.  Weight:  Weight: 121.8kg.  Weight: 268lb.  Body mass index:  BMI: 36.4kg/m^2.  Body  surface area:    BSA: 2.19m^2.  Blood pressure:     129/76.  Patient status:  Inpatient.  Location:  Echo laboratory    Cardiac catheterization 10/22/2012 Procedural Findings: Hemodynamics: AO 106/74 (92) LV 115/18 No gradient on pullback   Coronary angiography: Coronary dominance: right   Left mainstem: Normal with no obstruction   Left anterior descending (LAD): Courses to the apex and provides the apical tip.  Smooth throughout.  No evidence of significant obstruction   Left circumflex (LCx): Provides a smaller ramus and large marginal branch.  No significant obstruction.   Right coronary artery (RCA): Provides a PDA and PLA, both moderate in size.  No significant obstruction is noted.     Left ventriculography: Left ventricular systolic function is normal, LVEF is estimated at 55-65%, there is no significant mitral regurgitation.  There might be a tiny area of inferobasal hypokinesis, but this is not definitive.      Final Conclusions:   1.  Non STEMI by enzyme analysis 2.  No evidence of high grade obstruction.     Recommendations:  1.  DC smoking 2.  Medical therapy.   Shawnie Pons  MD, [facility] FSCAI 10/22/2012, 4:48 PM     Assessment and Plan:  1. Coronary artery spasm (HCC)   2. Mixed hyperlipidemia   3. Tobacco abuse   4. Suspected sleep apnea   5. SOB  (shortness of breath)   6. Morbid obesity (HCC)       1. Coronary artery spasm (HCC)/CAD History of coronary artery spasms.  History of NSTEMI back in 2013 with nonobstructive disease on cardiac catheterization. Recent stress test on 06/28/2021 was low risk with evidence of variable soft tissue attenuation, no significant ischemia, LVEF of 53%.  Currently denies any anginal symptoms.  Continue aspirin 81 mg daily, sublingual nitroglycerin as needed,  2. Mixed hyperlipidemia States he recently had elevated lipids at PCP office and states cholesterol was elevated but not prescribed statin medication.  He continues taking fish oil 1000 mg p.o. 3 times daily.  Recent lipid panel on 06/16/2021: TG 163, TC 225, HDL 37, LDL 155.  Start atorvastatin 10 mg daily.  Get FLP's and LFTs in 8 to 12 weeks.  3. Tobacco abuse He continues to smoke.   He was smoking 1 to 1-1/2 packs/day at prior visit with Mr. Alben Spittle.  I highly advised him to stop smoking.  4.  Suspected sleep apnea He has symptoms of snoring, apnea/hypoapnea, daytime hypersomnolence, fatigue, sluggishness.  He states he has an upcoming sleep study evaluation.  5.  DOE/SOB He is complaining of increased DOE and SOB over the last few months.  He describes it as worsening recently. Please get an echocardiogram to assess LV function, diastolic function and valvular function.  6.  Morbid obesity He has a BMI of 40.  Advised weight loss.  Medication Adjustments/Labs and Tests Ordered: Current medicines are reviewed at length with the patient today.  Concerns regarding medicines are outlined above.   Disposition: Follow-up with Dr. Wyline Mood or APP 6 months  Signed, Rennis Harding, NP 07/27/2021 10:24 AM    [facility] [address] [place], [place], [place] [address] Phone: [phone]; Fax: [phone]

## 2021-07-27 ENCOUNTER — Ambulatory Visit (INDEPENDENT_AMBULATORY_CARE_PROVIDER_SITE_OTHER): Payer: Medicaid Other | Admitting: Family Medicine

## 2021-07-27 ENCOUNTER — Encounter: Payer: Self-pay | Admitting: Family Medicine

## 2021-07-27 VITALS — BP 138/90 | HR 89 | Ht 72.0 in | Wt 298.2 lb

## 2021-07-27 DIAGNOSIS — E782 Mixed hyperlipidemia: Secondary | ICD-10-CM | POA: Diagnosis not present

## 2021-07-27 DIAGNOSIS — I201 Angina pectoris with documented spasm: Secondary | ICD-10-CM

## 2021-07-27 DIAGNOSIS — R29818 Other symptoms and signs involving the nervous system: Secondary | ICD-10-CM

## 2021-07-27 DIAGNOSIS — Z720 Tobacco use: Secondary | ICD-10-CM | POA: Diagnosis not present

## 2021-07-27 DIAGNOSIS — R0602 Shortness of breath: Secondary | ICD-10-CM

## 2021-07-27 MED ORDER — ATORVASTATIN CALCIUM 10 MG PO TABS: 10.0000 mg | ORAL_TABLET | Freq: Every day | ORAL | 6 refills | Status: DC

## 2021-07-27 NOTE — Patient Instructions (Signed)
Medication Instructions:  Stop Crestor (Rosuvastatin).  Begin Lipitor 10mg  daily.  Continue all other medications.     Labwork: FLP, LFT - due in 8-12 weeks. Will send reminder when time to do.   Testing/Procedures: Your physician has requested that you have an echocardiogram. Echocardiography is a painless test that uses sound waves to create images of your heart. It provides your doctor with information about the size and shape of your heart and how well your heart's chambers and valves are working. This procedure takes approximately one hour. There are no restrictions for this procedure. Office will contact with results via phone or letter.     Follow-Up: 6 months   Any Other Special Instructions Will Be Listed Below (If Applicable).   If you need a refill on your cardiac medications before your next appointment, please call your pharmacy.

## 2021-08-11 ENCOUNTER — Institutional Professional Consult (permissible substitution): Payer: Medicaid Other | Admitting: Pulmonary Disease

## 2021-08-23 ENCOUNTER — Ambulatory Visit (HOSPITAL_COMMUNITY): Admission: RE | Admit: 2021-08-23 | Payer: Medicaid Other | Source: Ambulatory Visit

## 2021-10-12 ENCOUNTER — Institutional Professional Consult (permissible substitution): Payer: Medicaid Other | Admitting: Pulmonary Disease

## 2021-12-14 ENCOUNTER — Institutional Professional Consult (permissible substitution): Payer: Medicaid Other | Admitting: Pulmonary Disease

## 2022-01-27 ENCOUNTER — Ambulatory Visit (INDEPENDENT_AMBULATORY_CARE_PROVIDER_SITE_OTHER): Payer: Medicaid Other | Admitting: Cardiology

## 2022-01-27 ENCOUNTER — Encounter: Payer: Self-pay | Admitting: Cardiology

## 2022-01-27 VITALS — BP 130/84 | HR 82 | Ht 72.0 in | Wt 305.8 lb

## 2022-01-27 DIAGNOSIS — E782 Mixed hyperlipidemia: Secondary | ICD-10-CM | POA: Diagnosis not present

## 2022-01-27 DIAGNOSIS — I201 Angina pectoris with documented spasm: Secondary | ICD-10-CM | POA: Diagnosis not present

## 2022-01-27 NOTE — Progress Notes (Signed)
? ? ? ?Clinical Summary ?Brian Deleon is a 41 y.o.male former patient of Dr Purvis Sheffield, this is our first visit together. He is seen for the following medical problems. ? ?1.Coronary vasospasm ?- history of NSTEMI secondary to vasospasm in 2013 ?- 06/2021 nuclear stress: no ischemia ? ?- no recent chest pains, no SOB/DOE ?- compliant with meds ? ? ?2. Hyperlipidemia ?06/2021 TC 225 TG 163 HDL 37 HDL 37 LDL 155 ?- reports more recent labs with pcp ?- he is on lipitor 10mg  daily ? ? ?3. OSA screen ?- upcoming appt with Dr ? ? ? ?Past Medical History:  ?Diagnosis Date  ? GERD (gastroesophageal reflux disease)   ? Hyperlipidemia   ? Non-ST elevation MI (NSTEMI) (HCC)   ? Question coronary vasospasm with angiographically NL cors; EF 55-65%, question tiny inferobasal HK, cardiac catheterization, 10/2012  ? Tobacco abuse   ? ? ? ?Allergies  ?Allergen Reactions  ? Elemental Sulfur Other (See Comments)  ?  Reaction unknown  ? Simvastatin   ?  Muscle aches   ? ? ? ?Current Outpatient Medications  ?Medication Sig Dispense Refill  ? amLODipine (NORVASC) 5 MG tablet Take 5 mg by mouth daily.    ? aspirin EC 81 MG EC tablet Take 1 tablet (81 mg total) by mouth daily.    ? atorvastatin (LIPITOR) 10 MG tablet Take 1 tablet (10 mg total) by mouth daily. 30 tablet 6  ? ELDERBERRY PO Take by mouth in the morning, at noon, and at bedtime.    ? nitroGLYCERIN (NITROSTAT) 0.4 MG SL tablet Place 1 tablet (0.4 mg total) under the tongue every 5 (five) minutes as needed for chest pain. 25 tablet 3  ? Omega-3 Fatty Acids (FISH OIL) 1000 MG CAPS Take by mouth in the morning, at noon, and at bedtime.    ? omeprazole (PRILOSEC) 20 MG capsule Take 1 capsule (20 mg total) by mouth daily. 90 capsule 3  ? ?No current facility-administered medications for this visit.  ? ? ? ?Past Surgical History:  ?Procedure Laterality Date  ? CARDIAC CATHETERIZATION  10/22/2012  ? angiographically normal coronaries; LVEF 55-65%; questionable inferobasal HK  ? LEFT  HEART CATHETERIZATION WITH CORONARY ANGIOGRAM N/A 10/22/2012  ? Procedure: LEFT HEART CATHETERIZATION WITH CORONARY ANGIOGRAM;  Surgeon: 10/24/2012, MD;  Location: [facility] CATH LAB;  Service: Cardiovascular;  Laterality: N/A;  ? ? ? ?Allergies  ?Allergen Reactions  ? Elemental Sulfur Other (See Comments)  ?  Reaction unknown  ? Simvastatin   ?  Muscle aches   ? ? ? ? ?Family History  ?Problem Relation Age of Onset  ? Heart attack Father   ? Hypertension Father   ? ? ? ?Social History ?Brian Deleon reports that he has been smoking cigarettes. He started smoking about 23 years ago. He has a 7.50 pack-year smoking history. He has never used smokeless tobacco. ?Brian Deleon reports no history of alcohol use. ? ? ?Review of Systems ?CONSTITUTIONAL: No weight loss, fever, chills, weakness or fatigue.  ?HEENT: Eyes: No visual loss, blurred vision, double vision or yellow sclerae.No hearing loss, sneezing, congestion, runny nose or sore throat.  ?SKIN: No rash or itching.  ?CARDIOVASCULAR: per hpi ?RESPIRATORY: No shortness of breath, cough or sputum.  ?GASTROINTESTINAL: No anorexia, nausea, vomiting or diarrhea. No abdominal pain or blood.  ?GENITOURINARY: No burning on urination, no polyuria ?NEUROLOGICAL: No headache, dizziness, syncope, paralysis, ataxia, numbness or tingling in the extremities. No change in bowel or bladder control.  ?  MUSCULOSKELETAL: No muscle, back pain, joint pain or stiffness.  ?LYMPHATICS: No enlarged nodes. No history of splenectomy.  ?PSYCHIATRIC: No history of depression or anxiety.  ?ENDOCRINOLOGIC: No reports of sweating, cold or heat intolerance. No polyuria or polydipsia.  ?. ? ? ?Physical Examination ?Today's Vitals  ? 01/27/22 0907  ?BP: 130/84  ?Pulse: 82  ?SpO2: 98%  ?Weight: (!) 305 lb 12.8 oz (138.7 kg)  ?Height: 6' (1.829 m)  ? ?Body mass index is 41.47 kg/m?. ? ?Gen: resting comfortably, no acute distress ?HEENT: no scleral icterus, pupils equal round and reactive, no palptable cervical  adenopathy,  ?CV: RRR, no m/r/g, no jvd ?Resp: Clear to auscultation bilaterally ?GI: abdomen is soft, non-tender, non-distended, normal bowel sounds, no hepatosplenomegaly ?MSK: extremities are warm, no edema.  ?Skin: warm, no rash ?Neuro:  no focal deficits ?Psych: appropriate affect ? ? ?Diagnostic Studies ?2013 cath ?Procedural Findings: ?Hemodynamics: ?AO 106/74 (92) ?LV 115/18 ?No gradient on pullback ?  ?Coronary angiography: ?Coronary dominance: right ?  ?Left mainstem: Normal with no obstruction ?  ?Left anterior descending (LAD): Courses to the apex and provides the apical tip.  Smooth throughout.  No evidence of significant obstruction ?  ?Left circumflex (LCx): Provides a smaller ramus and large marginal Markice Torbert.  No significant obstruction.  ?  ?Right coronary artery (RCA): Provides a PDA and PLA, both moderate in size.  No significant obstruction is noted.   ?  ?Left ventriculography: Left ventricular systolic function is normal, LVEF is estimated at 55-65%, there is no significant mitral regurgitation.  There might be a tiny area of inferobasal hypokinesis, but this is not definitive.    ?  ?Final Conclusions:   ?1.  Non STEMI by enzyme analysis ?2.  No evidence of high grade obstruction. ? ? ?06/2021 nuclear stress ? Findings are consistent with no significant ischemia. The study is low risk. ?  No ST deviation was noted. ?  There is no evidence of inducible ischemia and infarction. ?  Defect 1: There is a small defect with mild reduction in uptake present in the apical anteroseptal location(s) that is fixed. Defect 2: There is a small defect with mild reduction in uptake present in the basal inferolateral location(s) that is partially reversible in the setting of gut uptake. ?  Nuclear stress EF: 53 %. The left ventricular ejection fraction is normal (55-65%). Left ventricular function is normal. End diastolic cavity size is normal. End systolic cavity size is normal. ?  ?Low risk with evidence of  variable soft tissue attenuation, no significant ischemia, LVEF 53%. ?Assessment and Plan  ?1.Coronary vasospasm ?- no symptoms, continue norvasc ? ?2. Hyperlipidemia ?- request labs from pcp, continue lipitor ? ?F/u 1 year ? ? ? ? ?Antoine Poche, M.D. ?

## 2022-01-27 NOTE — Patient Instructions (Signed)

## 2022-01-31 ENCOUNTER — Encounter: Payer: Self-pay | Admitting: *Deleted

## 2022-02-16 ENCOUNTER — Institutional Professional Consult (permissible substitution): Payer: Medicaid Other | Admitting: Pulmonary Disease

## 2022-04-01 ENCOUNTER — Encounter: Payer: Self-pay | Admitting: Pulmonary Disease

## 2022-04-01 ENCOUNTER — Ambulatory Visit (INDEPENDENT_AMBULATORY_CARE_PROVIDER_SITE_OTHER): Payer: Medicaid Other | Admitting: Pulmonary Disease

## 2022-04-01 DIAGNOSIS — G4733 Obstructive sleep apnea (adult) (pediatric): Secondary | ICD-10-CM

## 2022-04-01 DIAGNOSIS — Z720 Tobacco use: Secondary | ICD-10-CM | POA: Diagnosis not present

## 2022-04-01 NOTE — Assessment & Plan Note (Signed)
Given excessive daytime somnolence, narrow pharyngeal exam, witnessed apneas & loud snoring, obstructive sleep apnea is very likely & an overnight polysomnogram will be scheduled as a split study. The pathophysiology of obstructive sleep apnea , it's cardiovascular consequences & modes of treatment including CPAP were discused with the patient in detail & they evidenced understanding.  Pretest probability is high.  He is willing to use a CPAP if needed.  He seems to be a mouth breather and will likely need a fullface mask

## 2022-04-01 NOTE — Patient Instructions (Signed)
X split study 

## 2022-04-01 NOTE — Progress Notes (Signed)
Subjective:    Patient ID: Brian Deleon, male    DOB: August 22, 1981, 41 y.o.   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  His wife has noted loud snoring, and this occasionally wakes him up.  He moves around a lot in his sleep. Epworth sleepiness score is 19 and he reports sleepiness if he sits down and stops activity.  He reports sleepiness while sitting and reading, watching TV, sitting inactive in a public place or as a passenger in a car. Bedtime is between 10 PM and midnight, sleep latency is variable, he sleeps on his back or on his side, reports 2-4 nocturnal awakenings due to nocturia and due to pain from herniated disc, he is out of bed anywhere between 5 and 7 AM feeling groggy initially with dryness of mouth. There is no history suggestive of cataplexy, sleep paralysis or parasomnias  There are several unintentional naps during the day. He drinks a lot of tea, 3 to 4 cups of coffee.  He used to drink Monster drinks but now  does not because he would crash heavily after the effect wore off  He smokes a pack per day, about 15 pack years  Past Medical History:  Diagnosis Date   GERD (gastroesophageal reflux disease)    Hyperlipidemia    Non-ST elevation MI (NSTEMI) (HCC)    Question coronary vasospasm with angiographically NL cors; EF 55-65%, question tiny inferobasal HK, cardiac catheterization, 10/2012   Tobacco abuse      Past Surgical History:  Procedure Laterality Date   CARDIAC CATHETERIZATION  10/22/2012   angiographically normal coronaries; LVEF 55-65%; questionable inferobasal HK   LEFT HEART CATHETERIZATION WITH CORONARY ANGIOGRAM N/A 10/22/2012   Procedure: LEFT HEART CATHETERIZATION WITH CORONARY ANGIOGRAM;  Surgeon: Herby Abraham, MD;  Location: [facility]  CATH LAB;  Service: Cardiovascular;  Laterality: N/A;    Allergies  Allergen Reactions   Elemental Sulfur Other (See Comments)    Reaction unknown   Simvastatin     Muscle aches     Social History   Socioeconomic History   Marital status: Single    Spouse name: Not on file   Number of children: Not on file   Years of education: Not on file   Highest education level: Not on file  Occupational History   Occupation: Curator  Tobacco Use   Smoking status: Every Day    Packs/day: 0.50    Years: 15.00    Pack years: 7.50    Types: Cigarettes    Start date: 06/12/1998   Smokeless tobacco: Never  Substance and Sexual Activity   Alcohol use: No    Alcohol/week: 0.0 standard drinks   Drug use: No   Sexual activity: Yes  Other Topics Concern   Not on file  Social History Narrative   Works as a Nurse, learning disability of Corporate investment banker Strain: Not on Ship broker Insecurity: Not on file  Transportation Needs: Not on file  Physical Activity: Not on file  Stress: Not on file  Social Connections: Not on file  Intimate Partner Violence: Not on file     Family History  Problem Relation Age of Onset   Heart attack Father    Hypertension Father     Review of Systems  Stiff joints Tingling in hands  Constitutional: negative for anorexia, fevers and sweats  Eyes: negative for irritation, redness and visual disturbance  Ears, nose, mouth, throat, and face: negative for earaches, epistaxis, nasal congestion and sore throat  Respiratory: negative for cough, dyspnea on exertion, sputum and wheezing  Cardiovascular: negative for chest pain, dyspnea, lower extremity edema, orthopnea, palpitations and syncope  Gastrointestinal: negative for abdominal pain, constipation, diarrhea, melena, nausea and vomiting  Genitourinary:negative for dysuria, frequency and hematuria  Hematologic/lymphatic: negative for bleeding, easy bruising and lymphadenopathy   Musculoskeletal:negative for arthralgias, muscle weakness  Neurological: negative for coordination problems, gait problems, headaches and weakness  Endocrine: negative for diabetic symptoms including polydipsia, polyuria and weight loss     Objective:   Physical Exam  Gen. Pleasant, obese, in no distress, normal affect ENT - no pallor,icterus, no post nasal drip, class 2-3 airway Neck: No JVD, no thyromegaly, no carotid bruits Lungs: no use of accessory muscles, no dullness to percussion, decreased without rales or rhonchi  Cardiovascular: Rhythm regular, heart sounds  normal, no murmurs or gallops, no peripheral edema Abdomen: soft and non-tender, no hepatosplenomegaly, BS normal. Musculoskeletal: No deformities, no cyanosis or clubbing Neuro:  alert, non focal, no tremors       Assessment & Plan:

## 2022-04-01 NOTE — Assessment & Plan Note (Signed)
Smoking cessation emphasized is the most important intervention that would add years to his life 

## 2022-06-10 ENCOUNTER — Encounter: Payer: Medicaid Other | Admitting: Pulmonary Disease

## 2022-07-04 ENCOUNTER — Other Ambulatory Visit: Payer: Self-pay

## 2022-07-04 ENCOUNTER — Emergency Department (HOSPITAL_COMMUNITY)
Admission: EM | Admit: 2022-07-04 | Discharge: 2022-07-05 | Disposition: A | Discharge status: Home / Self Care | Payer: Medicaid Other | Attending: Emergency Medicine | Admitting: Emergency Medicine

## 2022-07-04 ENCOUNTER — Encounter (HOSPITAL_COMMUNITY): Payer: Self-pay | Admitting: Emergency Medicine

## 2022-07-04 DIAGNOSIS — R319 Hematuria, unspecified: Secondary | ICD-10-CM | POA: Diagnosis not present

## 2022-07-04 DIAGNOSIS — I252 Old myocardial infarction: Secondary | ICD-10-CM | POA: Insufficient documentation

## 2022-07-04 DIAGNOSIS — Z720 Tobacco use: Secondary | ICD-10-CM | POA: Insufficient documentation

## 2022-07-04 DIAGNOSIS — R110 Nausea: Secondary | ICD-10-CM | POA: Insufficient documentation

## 2022-07-04 DIAGNOSIS — Z7982 Long term (current) use of aspirin: Secondary | ICD-10-CM | POA: Diagnosis not present

## 2022-07-04 DIAGNOSIS — I10 Essential (primary) hypertension: Secondary | ICD-10-CM | POA: Diagnosis not present

## 2022-07-04 DIAGNOSIS — R109 Unspecified abdominal pain: Secondary | ICD-10-CM

## 2022-07-04 HISTORY — DX: Essential (primary) hypertension: I10

## 2022-07-04 NOTE — ED Triage Notes (Signed)
Pt c/o right flank pain x 4 days with hematuria.

## 2022-07-05 ENCOUNTER — Emergency Department (HOSPITAL_COMMUNITY): Payer: Medicaid Other

## 2022-07-05 LAB — URINALYSIS, ROUTINE W REFLEX MICROSCOPIC
Bilirubin Urine: NEGATIVE
Glucose, UA: NEGATIVE mg/dL
Ketones, ur: NEGATIVE mg/dL
Leukocytes,Ua: NEGATIVE
Nitrite: NEGATIVE
Protein, ur: NEGATIVE mg/dL
Specific Gravity, Urine: 1.019 (ref 1.005–1.030)
pH: 5 (ref 5.0–8.0)

## 2022-07-05 MED ORDER — KETOROLAC TROMETHAMINE 60 MG/2ML IM SOLN
60.0000 mg | Freq: Once | INTRAMUSCULAR | Status: AC
  Administered 2022-07-05: 60 mg via INTRAMUSCULAR
  Filled 2022-07-05: qty 2

## 2022-07-05 MED ORDER — HYDROCODONE-ACETAMINOPHEN 5-325 MG PO TABS: 1.0000 | ORAL_TABLET | ORAL | 0 refills | Status: DC | PRN

## 2022-07-05 MED ORDER — HYDROCODONE-ACETAMINOPHEN 5-325 MG PO TABS
1.0000 | ORAL_TABLET | Freq: Once | ORAL | Status: AC
  Administered 2022-07-05: 1 via ORAL
  Filled 2022-07-05: qty 1

## 2022-07-05 NOTE — ED Provider Notes (Signed)
[facility] EMERGENCY DEPARTMENT Provider Note   CSN: [id number] Arrival date & time: 07/04/22  2331     History  Chief Complaint  Patient presents with   Flank Pain    Brian Deleon is a 41 y.o. male.  The history is provided by the patient and the spouse.  Flank Pain This is a new problem. The current episode started more than 2 days ago. The problem occurs daily. The problem has been gradually worsening. Pertinent negatives include no chest pain. Nothing relieves the symptoms.  Patient history of obesity, coronary artery spasm presents with flank pain and hematuria.  Patient reports for the past 4 days has had intermittent severe episodes of right flank pain and bloody urine.  No fevers or vomiting.  He does report occasional nausea.  No new chest pain or shortness of breath.  Patient reports he underwent CT imaging last month for hematuria and was told he had kidney stones    Past Medical History:  Diagnosis Date   GERD (gastroesophageal reflux disease)    Hyperlipidemia    Hypertension    Non-ST elevation MI (NSTEMI) ([place])    Question coronary vasospasm with angiographically NL cors; EF 55-65%, question tiny inferobasal HK, cardiac catheterization, 10/2012   Tobacco abuse     Home Medications Prior to Admission medications   Medication Sig Start Date End Date Taking? Authorizing Provider  HYDROcodone-acetaminophen (NORCO/VICODIN) 5-325 MG tablet Take 1 tablet by mouth every 4 (four) hours as needed. 07/05/22  Yes Ripley Fraise, MD  amLODipine (NORVASC) 5 MG tablet Take 5 mg by mouth daily.    [provider]  aspirin EC 81 MG EC tablet Take 1 tablet (81 mg total) by mouth daily. 10/23/12   Arguello, Roger A, PA-C  nitroGLYCERIN (NITROSTAT) 0.4 MG SL tablet Place 1 tablet (0.4 mg total) under the tongue every 5 (five) minutes as needed for chest pain. 04/17/19   Herminio Commons, MD  Omega-3 Fatty Acids (FISH OIL) 1000 MG CAPS Take by mouth in the  morning, at noon, and at bedtime.    [provider]  omeprazole (PRILOSEC) 20 MG capsule Take 1 capsule (20 mg total) by mouth daily. 01/29/18   Herminio Commons, MD      Allergies    Elemental sulfur and Simvastatin    Review of Systems   Review of Systems  Constitutional:  Negative for fever.  Cardiovascular:  Negative for chest pain.  Genitourinary:  Positive for flank pain and hematuria.    Physical Exam Updated Vital Signs BP 121/81   Pulse 69   Temp 98.2 F (36.8 C) (Oral)   Resp 18   Ht 1.829 m (6')   Wt 134.3 kg   SpO2 99%   BMI 40.14 kg/m  Physical Exam CONSTITUTIONAL: Well developed/well nourished, no distress HEAD: Normocephalic/atraumatic EYES: EOMI/PERRL ENMT: Mucous membranes moist NECK: supple no meningeal signs SPINE/BACK:entire spine nontender CV: S1/S2 noted, no murmurs/rubs/gallops noted LUNGS: Lungs are clear to auscultation bilaterally, no apparent distress ABDOMEN: soft, nontender, no rebound or guarding, bowel sounds noted throughout abdomen GU: Right cva tenderness, no erythema or bruising NEURO: Pt is awake/alert/appropriate, moves all extremitiesx4.  No facial droop.   EXTREMITIES: pulses normal/equal, full ROM SKIN: warm, color normal PSYCH: no abnormalities of mood noted, alert and oriented to situation  ED Results / Procedures / Treatments   Labs (all labs ordered are listed, but only abnormal results are displayed) Labs Reviewed  URINALYSIS, ROUTINE W REFLEX MICROSCOPIC -  Abnormal; Notable for the following components:      Result Value   Hgb urine dipstick SMALL (*)    Bacteria, UA RARE (*)    All other components within normal limits    EKG None  Radiology DG Abdomen 1 View  Result Date: 07/05/2022 CLINICAL DATA:  Right flank pain and hematuria x4 days. EXAM: ABDOMEN - 1 VIEW COMPARISON:  None Available. FINDINGS: The bowel gas pattern is normal. No radio-opaque calculi or other significant radiographic  abnormality are seen. IMPRESSION: Negative. Electronically Signed   By: Aram Candela M.D.   On: 07/05/2022 00:33    Procedures Procedures    Medications Ordered in ED Medications  HYDROcodone-acetaminophen (NORCO/VICODIN) 5-325 MG per tablet 1 tablet (has no administration in time range)  ketorolac (TORADOL) injection 60 mg (has no administration in time range)    ED Course/ Medical Decision Making/ A&P Clinical Course as of 07/05/22 0130  Tue Jul 05, 2022  0125 Hgb urine dipstick(!): SMALL Mild hematuria [DW]  0127 No convincing signs of ureteral stone on x-ray.  Patient in no acute distress but does report mild increasing pain.  No signs of UTI.  Here has outpatient renal ultrasound scheduled.  Will give referral to urology.  Will defer further work-up at this time.  Short course of pain medication provided.  We discussed strict  return precautions [DW]  0129 Pt in no distress, no focal abd tenderness, no groin pain.  No focal weakness.  Low suspicion for other acute emergent condition [DW]    Clinical Course User Index [DW] Zadie Rhine, MD                           Medical Decision Making Amount and/or Complexity of Data Reviewed Labs: ordered. Decision-making details documented in ED Course. Radiology: ordered.  Risk Prescription drug management.   This patient presents to the ED for concern of flank pain, this involves an extensive number of treatment options, and is a complaint that carries with it a high risk of complications and morbidity.  The differential diagnosis includes but is not limited to UTI, pyelonephritis, kidney stone, ureteral stone, muscle strain, discitis, epidural abscess  Comorbidities that complicate the patient evaluation: Patient's presentation is complicated by their history of obesity  Social Determinants of Health: Patient's  tobacco use   increases the complexity of managing their presentation  Additional history  obtained: Additional history obtained from spouse Records reviewed Care Everywhere/External Records CT imaging from last month reveals nephrolithiasis without obstructive uropathy  Lab Tests: I Ordered, and personally interpreted labs.  The pertinent results include:  hematuria  Imaging Studies ordered: I ordered imaging studies including X-ray KUB   I independently visualized and interpreted imaging which showed no acute findings I agree with the radiologist interpretation  Medicines ordered and prescription drug management: I ordered medication including vicodin and toradol  for pain     Complexity of problems addressed: Patient's presentation is most consistent with  acute complicated illness/injury requiring diagnostic workup  Disposition: After consideration of the diagnostic results and the patient's response to treatment,  I feel that the patent would benefit from discharge   .           Final Clinical Impression(s) / ED Diagnoses Final diagnoses:  Flank pain  Hematuria, unspecified type    Rx / DC Orders ED Discharge Orders          Ordered    HYDROcodone-acetaminophen (NORCO/VICODIN)  5-325 MG tablet  Every 4 hours PRN        07/05/22 0126              Zadie Rhine, MD 07/05/22 0130

## 2022-07-08 ENCOUNTER — Ambulatory Visit (INDEPENDENT_AMBULATORY_CARE_PROVIDER_SITE_OTHER): Payer: Medicaid Other | Admitting: Urology

## 2022-07-08 ENCOUNTER — Encounter: Payer: Self-pay | Admitting: Urology

## 2022-07-08 VITALS — BP 145/85 | HR 65

## 2022-07-08 DIAGNOSIS — N200 Calculus of kidney: Secondary | ICD-10-CM

## 2022-07-08 MED ORDER — OXYCODONE-ACETAMINOPHEN 5-325 MG PO TABS: 1.0000 | ORAL_TABLET | ORAL | 0 refills | Status: DC | PRN

## 2022-07-08 MED ORDER — ALFUZOSIN HCL ER 10 MG PO TB24: 10.0000 mg | ORAL_TABLET | Freq: Every day | ORAL | 1 refills | Status: DC

## 2022-07-08 MED ORDER — ONDANSETRON HCL 4 MG PO TABS: 4.0000 mg | ORAL_TABLET | Freq: Three times a day (TID) | ORAL | 0 refills | Status: DC | PRN

## 2022-07-08 NOTE — H&P (View-Only) (Signed)
07/08/2022 11:49 AM   Dionne Ano Belarus [birth year] [id number]  Referring provider: Toma Deiters, MD [address] [place],  [place] [address]  nephrolithiasis   HPI: Mr Belarus is a 41yo here for evaluation of nephrolithiasis. He underwent CT 05/30/2022 and was diagnosed with 2 lower pole right renal calculi. 1 week ago he developed right flank pain and presented to the ER 07/05/2022. He underwent KUB which showed a right proximal ureteral calculus. He had associated gross hematuria for 2 days last last. He has new urinary frequency, urgency for the past week. The right flank pain is sharp, intermittent moderate and nonraditing. He has mild intermittent right testis pain with the flank pain. He has nausea with the pain. No fevers.    PMH: Past Medical History:  Diagnosis Date   GERD (gastroesophageal reflux disease)    Hyperlipidemia    Hypertension    Non-ST elevation MI (NSTEMI) (HCC)    Question coronary vasospasm with angiographically NL cors; EF 55-65%, question tiny inferobasal HK, cardiac catheterization, 10/2012   Tobacco abuse     Surgical History: Past Surgical History:  Procedure Laterality Date   CARDIAC CATHETERIZATION  10/22/2012   angiographically normal coronaries; LVEF 55-65%; questionable inferobasal HK   LEFT HEART CATHETERIZATION WITH CORONARY ANGIOGRAM N/A 10/22/2012   Procedure: LEFT HEART CATHETERIZATION WITH CORONARY ANGIOGRAM;  Surgeon: Herby Abraham, MD;  Location: [facility] CATH LAB;  Service: Cardiovascular;  Laterality: N/A;    Home Medications:  Allergies as of 07/08/2022       Reactions   Elemental Sulfur Other (See Comments)   Reaction unknown   Simvastatin    Muscle aches         Medication List        Accurate as of July 08, 2022 11:49 AM. If you have any questions, ask your nurse or doctor.          amLODipine 5 MG tablet Commonly known as: NORVASC Take 5 mg by mouth daily.   aspirin EC 81 MG tablet Take 1 tablet (81 mg  total) by mouth daily.   Fish Oil 1000 MG Caps Take by mouth in the morning, at noon, and at bedtime.   HYDROcodone-acetaminophen 5-325 MG tablet Commonly known as: NORCO/VICODIN Take 1 tablet by mouth every 4 (four) hours as needed.   levocetirizine 5 MG tablet Commonly known as: XYZAL Take 5 mg by mouth every evening.   nitroGLYCERIN 0.4 MG SL tablet Commonly known as: Nitrostat Place 1 tablet (0.4 mg total) under the tongue every 5 (five) minutes as needed for chest pain.   omeprazole 20 MG capsule Commonly known as: PRILOSEC Take 1 capsule (20 mg total) by mouth daily.        Allergies:  Allergies  Allergen Reactions   Elemental Sulfur Other (See Comments)    Reaction unknown   Simvastatin     Muscle aches     Family History: Family History  Problem Relation Age of Onset   Heart attack Father    Hypertension Father     Social History:  reports that he has been smoking cigarettes. He started smoking about 24 years ago. He has a 7.50 pack-year smoking history. He has never used smokeless tobacco. He reports that he does not drink alcohol and does not use drugs.  ROS: All other review of systems were reviewed and are negative except what is noted above in HPI  Physical Exam: BP (!) 145/85   Pulse 65   Constitutional:  Alert and oriented, No acute distress. HEENT: [place] AT, moist mucus membranes.  Trachea midline, no masses. Cardiovascular: No clubbing, cyanosis, or edema. Respiratory: Normal respiratory effort, no increased work of breathing. GI: Abdomen is soft, nontender, nondistended, no abdominal masses GU: No CVA tenderness.  Lymph: No cervical or inguinal lymphadenopathy. Skin: No rashes, bruises or suspicious lesions. Neurologic: Grossly intact, no focal deficits, moving all 4 extremities. Psychiatric: Normal mood and affect.  Laboratory Data: Lab Results  Component Value Date   WBC 6.5 10/23/2012   HGB 14.7 10/23/2012   HCT 42.9 10/23/2012   MCV  91.1 10/23/2012   PLT 223 10/23/2012    Lab Results  Component Value Date   CREATININE 1.11 10/23/2012    No results found for: "PSA"  No results found for: "TESTOSTERONE"  No results found for: "HGBA1C"  Urinalysis    Component Value Date/Time   COLORURINE YELLOW 07/05/2022 0053   APPEARANCEUR CLEAR 07/05/2022 0053   LABSPEC 1.019 07/05/2022 0053   PHURINE 5.0 07/05/2022 0053   GLUCOSEU NEGATIVE 07/05/2022 0053   HGBUR SMALL (A) 07/05/2022 0053   BILIRUBINUR NEGATIVE 07/05/2022 0053   KETONESUR NEGATIVE 07/05/2022 0053   PROTEINUR NEGATIVE 07/05/2022 0053   NITRITE NEGATIVE 07/05/2022 0053   LEUKOCYTESUR NEGATIVE 07/05/2022 0053    Lab Results  Component Value Date   BACTERIA RARE (A) 07/05/2022    Pertinent Imaging: CT 05/30/2022 and KUB 8/29: Images reviewed and discussed with the patient  Results for orders placed during the hospital encounter of 07/04/22  DG Abdomen 1 View  Narrative CLINICAL DATA:  Right flank pain and hematuria x4 days.  EXAM: ABDOMEN - 1 VIEW  COMPARISON:  None Available.  FINDINGS: The bowel gas pattern is normal. No radio-opaque calculi or other significant radiographic abnormality are seen.  IMPRESSION: Negative.   Electronically Signed By: Aram Candela M.D. On: 07/05/2022 00:33  No results found for this or any previous visit.  No results found for this or any previous visit.  No results found for this or any previous visit.  No results found for this or any previous visit.  No results found for this or any previous visit.  No results found for this or any previous visit.  No results found for this or any previous visit.   Assessment & Plan:    1. Kidney stone -We discussed the management of kidney stones. These options include observation, ureteroscopy, shockwave lithotripsy (ESWL) and percutaneous nephrolithotomy (PCNL). We discussed which options are relevant to the patient's stone(s). We discussed the  natural history of kidney stones as well as the complications of untreated stones and the impact on quality of life without treatment as well as with each of the above listed treatments. We also discussed the efficacy of each treatment in its ability to clear the stone burden. With any of these management options I discussed the signs and symptoms of infection and the need for emergent treatment should these be experienced. For each option we discussed the ability of each procedure to clear the patient of their stone burden.   For observation I described the risks which include but are not limited to silent renal damage, life-threatening infection, need for emergent surgery, failure to pass stone and pain.   For ureteroscopy I described the risks which include bleeding, infection, damage to contiguous structures, positioning injury, ureteral stricture, ureteral avulsion, ureteral injury, need for prolonged ureteral stent, inability to perform ureteroscopy, need for an interval procedure, inability to clear stone burden, stent  discomfort/pain, heart attack, stroke, pulmonary embolus and the inherent risks with general anesthesia.   For shockwave lithotripsy I described the risks which include arrhythmia, kidney contusion, kidney hemorrhage, need for transfusion, pain, inability to adequately break up stone, inability to pass stone fragments, Steinstrasse, infection associated with obstructing stones, need for alternate surgical procedure, need for repeat shockwave lithotripsy, MI, CVA, PE and the inherent risks with anesthesia/conscious sedation.   For PCNL I described the risks including positioning injury, pneumothorax, hydrothorax, need for chest tube, inability to clear stone burden, renal laceration, arterial venous fistula or malformation, need for embolization of kidney, loss of kidney or renal function, need for repeat procedure, need for prolonged nephrostomy tube, ureteral avulsion, MI, CVA, PE and  the inherent risks of general anesthesia.   - The patient would like to proceed with medical expulsive therapy. RX for percocet, flomax and zofran given    No follow-ups on file.  Nicolette Bang, MD  [facility] [place]

## 2022-07-08 NOTE — Patient Instructions (Signed)

## 2022-07-08 NOTE — Progress Notes (Signed)
07/08/2022 11:49 AM   Dionne Ano Belarus [birth year] [id number]  Referring provider: Toma Deiters, MD [address] [place],  [place] [address]  nephrolithiasis   HPI: Mr Belarus is a 41yo here for evaluation of nephrolithiasis. He underwent CT 05/30/2022 and was diagnosed with 2 lower pole right renal calculi. 1 week ago he developed right flank pain and presented to the ER 07/05/2022. He underwent KUB which showed a right proximal ureteral calculus. He had associated gross hematuria for 2 days last last. He has new urinary frequency, urgency for the past week. The right flank pain is sharp, intermittent moderate and nonraditing. He has mild intermittent right testis pain with the flank pain. He has nausea with the pain. No fevers.    PMH: Past Medical History:  Diagnosis Date   GERD (gastroesophageal reflux disease)    Hyperlipidemia    Hypertension    Non-ST elevation MI (NSTEMI) (HCC)    Question coronary vasospasm with angiographically NL cors; EF 55-65%, question tiny inferobasal HK, cardiac catheterization, 10/2012   Tobacco abuse     Surgical History: Past Surgical History:  Procedure Laterality Date   CARDIAC CATHETERIZATION  10/22/2012   angiographically normal coronaries; LVEF 55-65%; questionable inferobasal HK   LEFT HEART CATHETERIZATION WITH CORONARY ANGIOGRAM N/A 10/22/2012   Procedure: LEFT HEART CATHETERIZATION WITH CORONARY ANGIOGRAM;  Surgeon: Herby Abraham, MD;  Location: [facility] CATH LAB;  Service: Cardiovascular;  Laterality: N/A;    Home Medications:  Allergies as of 07/08/2022       Reactions   Elemental Sulfur Other (See Comments)   Reaction unknown   Simvastatin    Muscle aches         Medication List        Accurate as of July 08, 2022 11:49 AM. If you have any questions, ask your nurse or doctor.          amLODipine 5 MG tablet Commonly known as: NORVASC Take 5 mg by mouth daily.   aspirin EC 81 MG tablet Take 1 tablet (81 mg  total) by mouth daily.   Fish Oil 1000 MG Caps Take by mouth in the morning, at noon, and at bedtime.   HYDROcodone-acetaminophen 5-325 MG tablet Commonly known as: NORCO/VICODIN Take 1 tablet by mouth every 4 (four) hours as needed.   levocetirizine 5 MG tablet Commonly known as: XYZAL Take 5 mg by mouth every evening.   nitroGLYCERIN 0.4 MG SL tablet Commonly known as: Nitrostat Place 1 tablet (0.4 mg total) under the tongue every 5 (five) minutes as needed for chest pain.   omeprazole 20 MG capsule Commonly known as: PRILOSEC Take 1 capsule (20 mg total) by mouth daily.        Allergies:  Allergies  Allergen Reactions   Elemental Sulfur Other (See Comments)    Reaction unknown   Simvastatin     Muscle aches     Family History: Family History  Problem Relation Age of Onset   Heart attack Father    Hypertension Father     Social History:  reports that he has been smoking cigarettes. He started smoking about 24 years ago. He has a 7.50 pack-year smoking history. He has never used smokeless tobacco. He reports that he does not drink alcohol and does not use drugs.  ROS: All other review of systems were reviewed and are negative except what is noted above in HPI  Physical Exam: BP (!) 145/85   Pulse 65   Constitutional:  Alert and oriented, No acute distress. HEENT: [place] AT, moist mucus membranes.  Trachea midline, no masses. Cardiovascular: No clubbing, cyanosis, or edema. Respiratory: Normal respiratory effort, no increased work of breathing. GI: Abdomen is soft, nontender, nondistended, no abdominal masses GU: No CVA tenderness.  Lymph: No cervical or inguinal lymphadenopathy. Skin: No rashes, bruises or suspicious lesions. Neurologic: Grossly intact, no focal deficits, moving all 4 extremities. Psychiatric: Normal mood and affect.  Laboratory Data: Lab Results  Component Value Date   WBC 6.5 10/23/2012   HGB 14.7 10/23/2012   HCT 42.9 10/23/2012   MCV  91.1 10/23/2012   PLT 223 10/23/2012    Lab Results  Component Value Date   CREATININE 1.11 10/23/2012    No results found for: "PSA"  No results found for: "TESTOSTERONE"  No results found for: "HGBA1C"  Urinalysis    Component Value Date/Time   COLORURINE YELLOW 07/05/2022 0053   APPEARANCEUR CLEAR 07/05/2022 0053   LABSPEC 1.019 07/05/2022 0053   PHURINE 5.0 07/05/2022 0053   GLUCOSEU NEGATIVE 07/05/2022 0053   HGBUR SMALL (A) 07/05/2022 0053   BILIRUBINUR NEGATIVE 07/05/2022 0053   KETONESUR NEGATIVE 07/05/2022 0053   PROTEINUR NEGATIVE 07/05/2022 0053   NITRITE NEGATIVE 07/05/2022 0053   LEUKOCYTESUR NEGATIVE 07/05/2022 0053    Lab Results  Component Value Date   BACTERIA RARE (A) 07/05/2022    Pertinent Imaging: CT 05/30/2022 and KUB 8/29: Images reviewed and discussed with the patient  Results for orders placed during the hospital encounter of 07/04/22  DG Abdomen 1 View  Narrative CLINICAL DATA:  Right flank pain and hematuria x4 days.  EXAM: ABDOMEN - 1 VIEW  COMPARISON:  None Available.  FINDINGS: The bowel gas pattern is normal. No radio-opaque calculi or other significant radiographic abnormality are seen.  IMPRESSION: Negative.   Electronically Signed By: Aram Candela M.D. On: 07/05/2022 00:33  No results found for this or any previous visit.  No results found for this or any previous visit.  No results found for this or any previous visit.  No results found for this or any previous visit.  No results found for this or any previous visit.  No results found for this or any previous visit.  No results found for this or any previous visit.   Assessment & Plan:    1. Kidney stone -We discussed the management of kidney stones. These options include observation, ureteroscopy, shockwave lithotripsy (ESWL) and percutaneous nephrolithotomy (PCNL). We discussed which options are relevant to the patient's stone(s). We discussed the  natural history of kidney stones as well as the complications of untreated stones and the impact on quality of life without treatment as well as with each of the above listed treatments. We also discussed the efficacy of each treatment in its ability to clear the stone burden. With any of these management options I discussed the signs and symptoms of infection and the need for emergent treatment should these be experienced. For each option we discussed the ability of each procedure to clear the patient of their stone burden.   For observation I described the risks which include but are not limited to silent renal damage, life-threatening infection, need for emergent surgery, failure to pass stone and pain.   For ureteroscopy I described the risks which include bleeding, infection, damage to contiguous structures, positioning injury, ureteral stricture, ureteral avulsion, ureteral injury, need for prolonged ureteral stent, inability to perform ureteroscopy, need for an interval procedure, inability to clear stone burden, stent  discomfort/pain, heart attack, stroke, pulmonary embolus and the inherent risks with general anesthesia.   For shockwave lithotripsy I described the risks which include arrhythmia, kidney contusion, kidney hemorrhage, need for transfusion, pain, inability to adequately break up stone, inability to pass stone fragments, Steinstrasse, infection associated with obstructing stones, need for alternate surgical procedure, need for repeat shockwave lithotripsy, MI, CVA, PE and the inherent risks with anesthesia/conscious sedation.   For PCNL I described the risks including positioning injury, pneumothorax, hydrothorax, need for chest tube, inability to clear stone burden, renal laceration, arterial venous fistula or malformation, need for embolization of kidney, loss of kidney or renal function, need for repeat procedure, need for prolonged nephrostomy tube, ureteral avulsion, MI, CVA, PE and  the inherent risks of general anesthesia.   - The patient would like to proceed with medical expulsive therapy. RX for percocet, flomax and zofran given    No follow-ups on file.  Nicolette Bang, MD  [facility] [place]

## 2022-07-15 ENCOUNTER — Ambulatory Visit (INDEPENDENT_AMBULATORY_CARE_PROVIDER_SITE_OTHER): Payer: Medicaid Other | Admitting: Urology

## 2022-07-15 ENCOUNTER — Ambulatory Visit (HOSPITAL_COMMUNITY)
Admission: RE | Admit: 2022-07-15 | Discharge: 2022-07-15 | Disposition: A | Discharge status: Home / Self Care | Payer: Medicaid Other | Source: Ambulatory Visit | Attending: Urology | Admitting: Urology

## 2022-07-15 ENCOUNTER — Encounter: Payer: Self-pay | Admitting: Urology

## 2022-07-15 VITALS — BP 128/80 | HR 80 | Ht 72.0 in | Wt 296.0 lb

## 2022-07-15 DIAGNOSIS — N200 Calculus of kidney: Secondary | ICD-10-CM | POA: Insufficient documentation

## 2022-07-15 LAB — URINALYSIS, ROUTINE W REFLEX MICROSCOPIC
Bilirubin, UA: NEGATIVE
Glucose, UA: NEGATIVE
Ketones, UA: NEGATIVE
Leukocytes,UA: NEGATIVE
Nitrite, UA: NEGATIVE
Protein,UA: NEGATIVE
RBC, UA: NEGATIVE
Specific Gravity, UA: 1.02 (ref 1.005–1.030)
Urobilinogen, Ur: 0.2 mg/dL (ref 0.2–1.0)
pH, UA: 5 (ref 5.0–7.5)

## 2022-07-15 MED ORDER — OXYCODONE-ACETAMINOPHEN 5-325 MG PO TABS: 1.0000 | ORAL_TABLET | ORAL | 0 refills | Status: DC | PRN

## 2022-07-15 NOTE — Progress Notes (Signed)
07/15/2022 11:37 AM   Brian Deleon 09/14/1981 [id number]  Referring provider: Toma Deiters, MD [address] [place],  [place] [address]  Right ureteral calculus   HPI: Brian Deleon is a 41yo here for followup for right ureteral calculus. He has not passed his calculus. He continues to have intermittent right flank pain. No hematuria. KUB from today shows a 64mm right proximal ureteral calculus.    PMH: Past Medical History:  Diagnosis Date   GERD (gastroesophageal reflux disease)    Hyperlipidemia    Hypertension    Non-ST elevation MI (NSTEMI) (HCC)    Question coronary vasospasm with angiographically NL cors; EF 55-65%, question tiny inferobasal HK, cardiac catheterization, 10/2012   Tobacco abuse     Surgical History: Past Surgical History:  Procedure Laterality Date   CARDIAC CATHETERIZATION  10/22/2012   angiographically normal coronaries; LVEF 55-65%; questionable inferobasal HK   LEFT HEART CATHETERIZATION WITH CORONARY ANGIOGRAM N/A 10/22/2012   Procedure: LEFT HEART CATHETERIZATION WITH CORONARY ANGIOGRAM;  Surgeon: Herby Abraham, MD;  Location: [facility] CATH LAB;  Service: Cardiovascular;  Laterality: N/A;    Home Medications:  Allergies as of 07/15/2022       Reactions   Elemental Sulfur Other (See Comments)   Reaction unknown   Simvastatin    Muscle aches         Medication List        Accurate as of July 15, 2022 11:37 AM. If you have any questions, ask your nurse or doctor.          alfuzosin 10 MG 24 hr tablet Commonly known as: UROXATRAL Take 1 tablet (10 mg total) by mouth at bedtime.   amLODipine 5 MG tablet Commonly known as: NORVASC Take 5 mg by mouth daily.   aspirin EC 81 MG tablet Take 1 tablet (81 mg total) by mouth daily.   Fish Oil 1000 MG Caps Take by mouth in the morning, at noon, and at bedtime.   HYDROcodone-acetaminophen 5-325 MG tablet Commonly known as: NORCO/VICODIN Take 1 tablet by mouth every 4 (four)  hours as needed.   levocetirizine 5 MG tablet Commonly known as: XYZAL Take 5 mg by mouth every evening.   nitroGLYCERIN 0.4 MG SL tablet Commonly known as: Nitrostat Place 1 tablet (0.4 mg total) under the tongue every 5 (five) minutes as needed for chest pain.   omeprazole 20 MG capsule Commonly known as: PRILOSEC Take 1 capsule (20 mg total) by mouth daily.   ondansetron 4 MG tablet Commonly known as: Zofran Take 1 tablet (4 mg total) by mouth every 8 (eight) hours as needed for nausea or vomiting.   oxyCODONE-acetaminophen 5-325 MG tablet Commonly known as: Percocet Take 1 tablet by mouth every 4 (four) hours as needed.        Allergies:  Allergies  Allergen Reactions   Elemental Sulfur Other (See Comments)    Reaction unknown   Simvastatin     Muscle aches     Family History: Family History  Problem Relation Age of Onset   Heart attack Father    Hypertension Father     Social History:  reports that he has been smoking cigarettes. He started smoking about 24 years ago. He has a 7.50 pack-year smoking history. He has never used smokeless tobacco. He reports that he does not drink alcohol and does not use drugs.  ROS: All other review of systems were reviewed and are negative except what is noted above in  HPI  Physical Exam: BP 128/80   Pulse 80   Ht 6' (1.829 m)   Wt 296 lb (134.3 kg)   BMI 40.14 kg/m   Constitutional:  Alert and oriented, No acute distress. HEENT: [place] AT, moist mucus membranes.  Trachea midline, no masses. Cardiovascular: No clubbing, cyanosis, or edema. Respiratory: Normal respiratory effort, no increased work of breathing. GI: Abdomen is soft, nontender, nondistended, no abdominal masses GU: No CVA tenderness.  Lymph: No cervical or inguinal lymphadenopathy. Skin: No rashes, bruises or suspicious lesions. Neurologic: Grossly intact, no focal deficits, moving all 4 extremities. Psychiatric: Normal mood and affect.  Laboratory  Data: Lab Results  Component Value Date   WBC 6.5 10/23/2012   HGB 14.7 10/23/2012   HCT 42.9 10/23/2012   MCV 91.1 10/23/2012   PLT 223 10/23/2012    Lab Results  Component Value Date   CREATININE 1.11 10/23/2012    No results found for: "PSA"  No results found for: "TESTOSTERONE"  No results found for: "HGBA1C"  Urinalysis    Component Value Date/Time   COLORURINE YELLOW 07/05/2022 0053   APPEARANCEUR CLEAR 07/05/2022 0053   LABSPEC 1.019 07/05/2022 0053   PHURINE 5.0 07/05/2022 0053   GLUCOSEU NEGATIVE 07/05/2022 0053   HGBUR SMALL (A) 07/05/2022 0053   BILIRUBINUR NEGATIVE 07/05/2022 0053   KETONESUR NEGATIVE 07/05/2022 0053   PROTEINUR NEGATIVE 07/05/2022 0053   NITRITE NEGATIVE 07/05/2022 0053   LEUKOCYTESUR NEGATIVE 07/05/2022 0053    Lab Results  Component Value Date   BACTERIA RARE (A) 07/05/2022    Pertinent Imaging: KUB today: Images reviewed and discussed with the patient  Results for orders placed during the hospital encounter of 07/04/22  DG Abdomen 1 View  Narrative CLINICAL DATA:  Right flank pain and hematuria x4 days.  EXAM: ABDOMEN - 1 VIEW  COMPARISON:  None Available.  FINDINGS: The bowel gas pattern is normal. No radio-opaque calculi or other significant radiographic abnormality are seen.  IMPRESSION: Negative.   Electronically Signed By: Aram Candela M.D. On: 07/05/2022 00:33  No results found for this or any previous visit.  No results found for this or any previous visit.  No results found for this or any previous visit.  No results found for this or any previous visit.  No results found for this or any previous visit.  No results found for this or any previous visit.  No results found for this or any previous visit.   Assessment & Plan:    1. Kidney stone -We discussed the management of kidney stones. These options include observation, ureteroscopy, shockwave lithotripsy (ESWL) and percutaneous  nephrolithotomy (PCNL). We discussed which options are relevant to the patient's stone(s). We discussed the natural history of kidney stones as well as the complications of untreated stones and the impact on quality of life without treatment as well as with each of the above listed treatments. We also discussed the efficacy of each treatment in its ability to clear the stone burden. With any of these management options I discussed the signs and symptoms of infection and the need for emergent treatment should these be experienced. For each option we discussed the ability of each procedure to clear the patient of their stone burden.   For observation I described the risks which include but are not limited to silent renal damage, life-threatening infection, need for emergent surgery, failure to pass stone and pain.   For ureteroscopy I described the risks which include bleeding, infection, damage to contiguous  structures, positioning injury, ureteral stricture, ureteral avulsion, ureteral injury, need for prolonged ureteral stent, inability to perform ureteroscopy, need for an interval procedure, inability to clear stone burden, stent discomfort/pain, heart attack, stroke, pulmonary embolus and the inherent risks with general anesthesia.   For shockwave lithotripsy I described the risks which include arrhythmia, kidney contusion, kidney hemorrhage, need for transfusion, pain, inability to adequately break up stone, inability to pass stone fragments, Steinstrasse, infection associated with obstructing stones, need for alternate surgical procedure, need for repeat shockwave lithotripsy, MI, CVA, PE and the inherent risks with anesthesia/conscious sedation.   For PCNL I described the risks including positioning injury, pneumothorax, hydrothorax, need for chest tube, inability to clear stone burden, renal laceration, arterial venous fistula or malformation, need for embolization of kidney, loss of kidney or renal  function, need for repeat procedure, need for prolonged nephrostomy tube, ureteral avulsion, MI, CVA, PE and the inherent risks of general anesthesia.   - The patient would like to proceed with right ESWL - Urinalysis, Routine w reflex microscopic   No follow-ups on file.  Wilkie Aye, MD  [facility] [place]

## 2022-07-15 NOTE — Patient Instructions (Addendum)
Dear Brian Deleon,   Thank you for choosing [facility] [place] to assist in your urologic care for your upcoming surgery. The following information below includes specific dates and details related to surgery:   The Surgical Procedure you are scheduled to have performed is Extracorporeal Shock Wave Lithotripsy   Surgery Date:    Physician performing the surgery: Dr Wilkie Aye   Do not eat or drink after midnight the day before your surgery.    You will need a driver the day of surgery and will not be able to operate heavy machinery for 24 hours after.    Your surgery will be performed at  [facility] [address], [place] [address]   Enter at the Main Entrance and check in at the SAME DAY SURGERY desk.     Pre-Admit Testing Info   Pre- Admit appointments are interview with an anesthesiologist or a pre-operative anesthesia nurse. These appointments are typically completed as an in person visit but can take place over the telephone.  You will be contacted to confirm the date and time window.   Post op- Prior to coming to your 2 week post op, please stop by [facility] Radiology department to have a xray completed. The order is placed for you and you will not need an appointment for the xray. After completing the xray please come to your schedule appointment time.    If you have any questions or concerns, please don't hesitate to call the office at [phone]   Thank you,    Alfredo Martinez, RN Clinical Surgery Coordinator [facility]   Lithotripsy  Lithotripsy is a treatment that can help break up kidney stones that are too large to pass on their own. This is a nonsurgical procedure that crushes a kidney stone with shock waves. These shock waves pass through your body and focus on the kidney stone. They cause the kidney stone to break up into smaller pieces while it is still in the urinary tract. The smaller pieces of stone can pass more  easily out of your body in the urine. Tell a health care provider about: Any allergies you have. All medicines you are taking, including vitamins, herbs, eye drops, creams, and over-the-counter medicines. Any problems you or family members have had with anesthetic medicines. Any blood disorders you have. Any surgeries you have had. Any medical conditions you have. Whether you are pregnant or may be pregnant. What are the risks? Generally, this is a safe procedure. However, problems may occur, including: Infection. Bleeding from the kidney. Bruising of the kidney or skin. Scarring of the kidney, which can lead to: Increased blood pressure. Poor kidney function. Return (recurrence) of kidney stones. Damage to other structures or organs, such as the liver, colon, spleen, or pancreas. Blockage (obstruction) of the tube that carries urine from the kidney to the bladder (ureter). Failure of the kidney stone to break into pieces (fragments). What happens before the procedure? Staying hydrated Follow instructions from your health care provider about hydration, which may include: Up to 2 hours before the procedure - you may continue to drink clear liquids, such as water, clear fruit juice, black coffee, and plain tea. Eating and drinking restrictions Follow instructions from your health care provider about eating and drinking, which may include: 8 hours before the procedure - stop eating heavy meals or foods, such as meat, fried foods, or fatty foods. 6 hours before the procedure - stop eating light  meals or foods, such as toast or cereal. 6 hours before the procedure - stop drinking milk or drinks that contain milk. 2 hours before the procedure - stop drinking clear liquids. Medicines Ask your health care provider about: Changing or stopping your regular medicines. This is especially important if you are taking diabetes medicines or blood thinners. Taking medicines such as aspirin and  ibuprofen. These medicines can thin your blood. Do not take these medicines unless your health care provider tells you to take them. Taking over-the-counter medicines, vitamins, herbs, and supplements. Tests You may have tests, such as: Blood tests. Urine tests. Imaging tests, such as a CT scan. General instructions Plan to have someone take you home from the hospital or clinic. If you will be going home right after the procedure, plan to have someone with you for 24 hours. Ask your health care provider what steps will be taken to help prevent infection. These may include washing skin with a germ-killing soap. What happens during the procedure?  An IV will be inserted into one of your veins. You will be given one or more of the following: A medicine to help you relax (sedative). A medicine to make you fall asleep (general anesthetic). A water-filled cushion may be placed behind your kidney or on your abdomen. In some cases, you may be placed in a tub of lukewarm water. Your body will be positioned in a way that makes it easy to target the kidney stone. An X-ray or ultrasound exam will be done to locate your stone. Shock waves will be aimed at the stone. If you are awake, you may feel a tapping sensation as the shock waves pass through your body. A flexible tube with holes in it (stent) may be placed in the ureter. This will help keep urine flowing from the kidney if the fragments of the stone have been blocking the ureter. The procedure may vary among health care providers and hospitals. What happens after the procedure? You may have an X-ray to see whether the procedure was able to break up the kidney stone and how much of the stone has passed. If large stone fragments remain after treatment, you may need to have a second procedure at a later time. Your blood pressure, heart rate, breathing rate, and blood oxygen level will be monitored until you leave the hospital or clinic. You may be  given antibiotics or pain medicine as needed. If a stent was placed in your ureter during surgery, it may stay in place for a few weeks. You may need to strain your urine to collect pieces of the kidney stone for testing. You will need to drink plenty of water. If you were given a sedative during the procedure, it can affect you for several hours. Do not drive or operate machinery until your health care provider says that it is safe. Summary Lithotripsy is a treatment that can help break up kidney stones that are too large to pass on their own. Lithotripsy is a nonsurgical procedure that crushes a kidney stone with shock waves. Generally, this is a safe procedure. However, problems may occur, including damage to the kidney or other organs, infection, or obstruction of the tube that carries urine from the kidney to the bladder (ureter). You may have a stent placed in your ureter to help drain your urine. This stent may stay in place for a few weeks. After the procedure, you will need to drink plenty of water. You may be asked to  strain your urine to collect pieces of the kidney stone for testing. This information is not intended to replace advice given to you by your health care provider. Make sure you discuss any questions you have with your health care provider. Document Revised: 09/20/2021 Document Reviewed: 06/28/2021 Elsevier Patient Education  2023 [employer].

## 2022-07-20 ENCOUNTER — Encounter (HOSPITAL_COMMUNITY): Payer: Self-pay

## 2022-07-20 ENCOUNTER — Other Ambulatory Visit: Payer: Self-pay

## 2022-07-20 NOTE — Progress Notes (Signed)
   07/20/22 1627  OBSTRUCTIVE SLEEP APNEA  Have you ever been diagnosed with sleep apnea through a sleep study? No  Do you snore loudly (loud enough to be heard through closed doors)?  1  Do you often feel tired, fatigued, or sleepy during the daytime (such as falling asleep during driving or talking to someone)? 1  Has anyone observed you stop breathing during your sleep? 1  Do you have, or are you being treated for high blood pressure? 1  BMI more than 35 kg/m2? 1  Age > 50 (1-yes) 0  Neck circumference greater than:Male 16 inches or larger, Male 17inches or larger? 0  Male Gender (Yes=1) 1  Obstructive Sleep Apnea Score 6  Score 5 or greater  Results sent to PCP

## 2022-07-21 ENCOUNTER — Encounter (HOSPITAL_COMMUNITY)
Admission: RE | Admit: 2022-07-21 | Discharge: 2022-07-21 | Disposition: A | Discharge status: Home / Self Care | Payer: Medicaid Other | Source: Ambulatory Visit | Attending: Urology | Admitting: Urology

## 2022-07-26 ENCOUNTER — Ambulatory Visit (HOSPITAL_COMMUNITY)
Admission: RE | Admit: 2022-07-26 | Discharge: 2022-07-26 | Disposition: A | Discharge status: Home / Self Care | Payer: Medicaid Other | Attending: Urology | Admitting: Urology

## 2022-07-26 ENCOUNTER — Encounter (HOSPITAL_COMMUNITY): Payer: Self-pay | Admitting: Urology

## 2022-07-26 ENCOUNTER — Encounter (HOSPITAL_COMMUNITY)
Admission: RE | Disposition: A | Discharge status: Home / Self Care | Payer: Self-pay | Source: Home / Self Care | Attending: Urology

## 2022-07-26 ENCOUNTER — Ambulatory Visit (HOSPITAL_COMMUNITY): Payer: Medicaid Other

## 2022-07-26 DIAGNOSIS — N201 Calculus of ureter: Secondary | ICD-10-CM | POA: Diagnosis not present

## 2022-07-26 DIAGNOSIS — F17210 Nicotine dependence, cigarettes, uncomplicated: Secondary | ICD-10-CM | POA: Diagnosis not present

## 2022-07-26 DIAGNOSIS — G4730 Sleep apnea, unspecified: Secondary | ICD-10-CM | POA: Insufficient documentation

## 2022-07-26 DIAGNOSIS — I10 Essential (primary) hypertension: Secondary | ICD-10-CM | POA: Insufficient documentation

## 2022-07-26 DIAGNOSIS — I252 Old myocardial infarction: Secondary | ICD-10-CM | POA: Insufficient documentation

## 2022-07-26 DIAGNOSIS — E669 Obesity, unspecified: Secondary | ICD-10-CM | POA: Insufficient documentation

## 2022-07-26 DIAGNOSIS — Z6841 Body Mass Index (BMI) 40.0 and over, adult: Secondary | ICD-10-CM | POA: Insufficient documentation

## 2022-07-26 DIAGNOSIS — Z7982 Long term (current) use of aspirin: Secondary | ICD-10-CM | POA: Insufficient documentation

## 2022-07-26 HISTORY — PX: EXTRACORPOREAL SHOCK WAVE LITHOTRIPSY: SHX1557

## 2022-07-26 SURGERY — LITHOTRIPSY, ESWL
Anesthesia: LOCAL | Laterality: Right

## 2022-07-26 MED ORDER — OXYCODONE-ACETAMINOPHEN 5-325 MG PO TABS: 1.0000 | ORAL_TABLET | ORAL | 0 refills | Status: DC | PRN

## 2022-07-26 MED ORDER — DIPHENHYDRAMINE HCL 25 MG PO CAPS
25.0000 mg | ORAL_CAPSULE | ORAL | Status: AC
  Administered 2022-07-26: 25 mg via ORAL
  Filled 2022-07-26: qty 1

## 2022-07-26 MED ORDER — DIAZEPAM 5 MG PO TABS
10.0000 mg | ORAL_TABLET | Freq: Once | ORAL | Status: AC
  Administered 2022-07-26: 10 mg via ORAL
  Filled 2022-07-26: qty 2

## 2022-07-26 MED ORDER — SODIUM CHLORIDE 0.9 % IV SOLN
INTRAVENOUS | Status: DC
  Administered 2022-07-26: 1000 mL via INTRAVENOUS

## 2022-07-26 MED ORDER — ONDANSETRON HCL 4 MG PO TABS: 4.0000 mg | ORAL_TABLET | Freq: Three times a day (TID) | ORAL | 0 refills | Status: DC | PRN

## 2022-07-26 NOTE — Interval H&P Note (Signed)
History and Physical Interval Note:  07/26/2022 10:18 AM  Brian Deleon  has presented today for surgery, with the diagnosis of right ureteral calculus.  The various methods of treatment have been discussed with the patient and family. After consideration of risks, benefits and other options for treatment, the patient has consented to  Procedure(s): EXTRACORPOREAL SHOCK WAVE LITHOTRIPSY (ESWL) (Right) as a surgical intervention.  The patient's history has been reviewed, patient examined, no change in status, stable for surgery.  I have reviewed the patient's chart and labs.  Questions were answered to the patient's satisfaction.     Nicolette Bang

## 2022-07-29 ENCOUNTER — Encounter (HOSPITAL_COMMUNITY): Payer: Self-pay | Admitting: Urology

## 2022-07-30 ENCOUNTER — Other Ambulatory Visit: Payer: Self-pay | Admitting: Urology

## 2022-08-11 ENCOUNTER — Ambulatory Visit: Payer: Medicaid Other | Admitting: Physician Assistant

## 2022-08-26 ENCOUNTER — Other Ambulatory Visit: Payer: Self-pay | Admitting: Urology

## 2023-03-16 ENCOUNTER — Encounter: Payer: Self-pay | Admitting: Orthopaedic Surgery

## 2023-03-16 ENCOUNTER — Other Ambulatory Visit: Payer: Self-pay

## 2023-03-16 ENCOUNTER — Ambulatory Visit (INDEPENDENT_AMBULATORY_CARE_PROVIDER_SITE_OTHER): Payer: Medicaid Other | Admitting: Orthopaedic Surgery

## 2023-03-16 VITALS — Ht 72.0 in | Wt 315.0 lb

## 2023-03-16 DIAGNOSIS — M542 Cervicalgia: Secondary | ICD-10-CM

## 2023-03-16 DIAGNOSIS — M79641 Pain in right hand: Secondary | ICD-10-CM

## 2023-03-16 DIAGNOSIS — R200 Anesthesia of skin: Secondary | ICD-10-CM

## 2023-03-16 NOTE — Progress Notes (Signed)
Office Visit Note   Patient: Brian Deleon           Date of Birth: June 16, 1981           MRN: [id number] Visit Date: 03/16/2023              Requested by: Toma Deiters, MD [address] [place],  [place] [address] PCP: Toma Deiters, MD   Assessment & Plan: Visit Diagnoses:  1. Neck pain   2. Pain in right hand   3. Bilateral hand numbness     Plan: Will proceed with nerve conduction velocities office follow-up after test and then we can proceed with likely carpal tunnel release based on his multiple months of persistent progressive symptoms that bothers him at work and on daily activities.  Follow-Up Instructions: No follow-ups on file.   Orders:  Orders Placed This Encounter  Procedures   XR Cervical Spine 2 or 3 views   XR Wrist 2 Views Right   Ambulatory referral to Physical Medicine Rehab   No orders of the defined types were placed in this encounter.     Procedures: No procedures performed   Clinical Data: No additional findings.   Subjective: Chief Complaint  Patient presents with   Neck - Pain   Right Hand - Pain    HPI 42 year old male with history of gout on allopurinol occasionally uses colchicine here with bilateral hand numbness worse on the right than left hand.  He drops objects wakes him up at night he has to get up walk around and shake his hands.  He has used ibuprofen also tried some oxycodone when he had a few pills left over after being treated for kidney stones.  He has used wrist splints at night without relief topical gel without relief.  Old history of right wrist fracture in early 20s.  Denies symptoms of gout in his wrist or hands.  No associated neck pain.  He is referred by Dr. Olena Leatherwood for bilateral carpal tunnel syndrome.  Office notes sent from his office are reviewed.  Patient states he has trouble driving a car yes keep shaking his wrist with hand numbness dropping objects.  Review of Systems all other systems noncontributory  to HPI.   Objective: Vital Signs: Ht 6' (1.829 m)   Wt (!) 315 lb (142.9 kg)   BMI 42.72 kg/m   Physical Exam Constitutional:      Appearance: He is well-developed.  HENT:     Head: Normocephalic and atraumatic.     Right Ear: External ear normal.     Left Ear: External ear normal.  Eyes:     Pupils: Pupils are equal, round, and reactive to light.  Neck:     Thyroid: No thyromegaly.     Trachea: No tracheal deviation.  Cardiovascular:     Rate and Rhythm: Normal rate.  Pulmonary:     Effort: Pulmonary effort is normal.     Breath sounds: No wheezing.  Abdominal:     General: Bowel sounds are normal.     Palpations: Abdomen is soft.  Musculoskeletal:     Cervical back: Neck supple.  Skin:    General: Skin is warm and dry.     Capillary Refill: Capillary refill takes less than 2 seconds.  Neurological:     Mental Status: He is alert and oriented to person, place, and time.  Psychiatric:        Behavior: Behavior normal.  Thought Content: Thought content normal.        Judgment: Judgment normal.     Ortho Exam bilateral positive Phalen's worse on the right than left thenar mild atrophy and weakness resisted testing on the right hand normal on the left hand.  Decreased sensation median distribution right and left hand with decreased worse on the right than left.  Good cervical range of motion no brachial plexus tenderness.  Specialty Comments:  No specialty comments available.  Imaging: No results found.   PMFS History: Patient Active Problem List   Diagnosis Date Noted   Bilateral hand numbness 03/16/2023   OSA (obstructive sleep apnea) 04/01/2022   Coronary artery spasm (HCC) 02/26/2014   NSTEMI (non-ST elevated myocardial infarction) (HCC) 10/23/2012   Hyperlipidemia 10/23/2012   GERD (gastroesophageal reflux disease) 10/23/2012   Tobacco abuse 10/23/2012   Malaise and fatigue 10/23/2012   Nausea 10/21/2012   Headache(784.0) 10/21/2012   Past  Medical History:  Diagnosis Date   GERD (gastroesophageal reflux disease)    Hyperlipidemia    Hypertension    Non-ST elevation MI (NSTEMI) (HCC)    Question coronary vasospasm with angiographically NL cors; EF 55-65%, question tiny inferobasal HK, cardiac catheterization, 10/2012   Tobacco abuse     Family History  Problem Relation Age of Onset   Heart attack Father    Hypertension Father     Past Surgical History:  Procedure Laterality Date   CARDIAC CATHETERIZATION  10/22/2012   angiographically normal coronaries; LVEF 55-65%; questionable inferobasal HK   EXTRACORPOREAL SHOCK WAVE LITHOTRIPSY Right 07/26/2022   Procedure: EXTRACORPOREAL SHOCK WAVE LITHOTRIPSY (ESWL);  Surgeon: Malen Gauze, MD;  Location: [facility];  Service: Urology;  Laterality: Right;   LEFT HEART CATHETERIZATION WITH CORONARY ANGIOGRAM N/A 10/22/2012   Procedure: LEFT HEART CATHETERIZATION WITH CORONARY ANGIOGRAM;  Surgeon: Herby Abraham, MD;  Location: [facility] CATH LAB;  Service: Cardiovascular;  Laterality: N/A;   Social History   Occupational History   Occupation: Curator  Tobacco Use   Smoking status: Every Day    Packs/day: 0.50    Years: 15.00    Additional pack years: 0.00    Total pack years: 7.50    Types: Cigarettes    Start date: 06/12/1998   Smokeless tobacco: Never  Vaping Use   Vaping Use: Never used  Substance and Sexual Activity   Alcohol use: No    Alcohol/week: 0.0 standard drinks of alcohol   Drug use: No   Sexual activity: Yes

## 2023-03-21 ENCOUNTER — Ambulatory Visit: Payer: Medicaid Other | Admitting: Physical Medicine and Rehabilitation

## 2023-03-21 DIAGNOSIS — M79641 Pain in right hand: Secondary | ICD-10-CM

## 2023-03-21 DIAGNOSIS — M542 Cervicalgia: Secondary | ICD-10-CM | POA: Diagnosis not present

## 2023-03-21 DIAGNOSIS — R531 Weakness: Secondary | ICD-10-CM | POA: Diagnosis not present

## 2023-03-21 DIAGNOSIS — R202 Paresthesia of skin: Secondary | ICD-10-CM

## 2023-03-21 NOTE — Progress Notes (Unsigned)
Functional Pain Scale - descriptive words and definitions  Distracting (5)    Aware of pain/able to complete some ADL's but limited by pain/sleep is affected and active distractions are only slightly useful. Moderate range order  Average Pain 9 when pain gets severe  Right handed. Right hand numbness, pain and stiffness. Can have pain in right elbow

## 2023-03-22 NOTE — Procedures (Unsigned)
EMG & NCV Findings: Evaluation of the left median motor and the right median motor nerves showed prolonged distal onset latency (L5.1, R6.4 ms) and decreased conduction velocity (Elbow-Wrist, L48, R46 m/s).  The left median (across palm) sensory nerve showed no response (Palm) and prolonged distal peak latency (5.4 ms).  The right median (across palm) sensory nerve showed prolonged distal peak latency (Wrist, 5.9 ms) and prolonged distal peak latency (Palm, 4.6 ms).  The right ulnar sensory nerve showed reduced amplitude (13.1 V).  All remaining nerves (as indicated in the following tables) were within normal limits.  Left vs. Right side comparison data for the median motor nerve indicates abnormal L-R latency difference (1.3 ms).    All examined muscles (as indicated in the following table) showed no evidence of electrical instability.    Impression: The above electrodiagnostic study is ABNORMAL and reveals evidence of:  a moderate to severe right median nerve entrapment at the wrist (carpal tunnel syndrome) affecting sensory and motor components.   a moderate left median nerve entrapment at the wrist (carpal tunnel syndrome) affecting sensory and motor components.  There is no significant electrodiagnostic evidence of any other focal nerve entrapment, brachial plexopathy or cervical radiculopathy.   Recommendations: 1.  Follow-up with referring physician. 2.  Continue current management of symptoms. 3.  Suggest surgical evaluation.  ___________________________ Naaman Plummer FAAPMR Board Certified, American Board of Physical Medicine and Rehabilitation    Nerve Conduction Studies Anti Sensory Summary Table   Stim Site NR Peak (ms) Norm Peak (ms) P-T Amp (V) Norm P-T Amp Site1 Site2 Delta-P (ms) Dist (cm) Vel (m/s) Norm Vel (m/s)  Left Median Acr Palm Anti Sensory (2nd Digit)  32.4C  Wrist    *5.4 <3.6 13.1 >10 Wrist Palm  0.0    Palm *NR  <2.0          Right Median Acr Palm Anti  Sensory (2nd Digit)  31.8C  Wrist    *5.9 <3.6 11.2 >10 Wrist Palm 1.3 0.0    Palm    *4.6 <2.0 0.1         Right Radial Anti Sensory (Base 1st Digit)  31.2C  Wrist    1.9 <3.1 20.0  Wrist Base 1st Digit 1.9 0.0    Right Ulnar Anti Sensory (5th Digit)  32C  Wrist    3.4 <3.7 *13.1 >15.0 Wrist 5th Digit 3.4 14.0 41 >38   Motor Summary Table   Stim Site NR Onset (ms) Norm Onset (ms) O-P Amp (mV) Norm O-P Amp Site1 Site2 Delta-0 (ms) Dist (cm) Vel (m/s) Norm Vel (m/s)  Left Median Motor (Abd Poll Brev)  32.4C  Wrist    *5.1 <4.2 6.3 >5 Elbow Wrist 5.0 24.0 *48 >50  Elbow    10.1  6.2         Right Median Motor (Abd Poll Brev)  31.7C  Wrist    *6.4 <4.2 5.0 >5 Elbow Wrist 5.0 23.0 *46 >50  Elbow    11.4  4.8         Right Ulnar Motor (Abd Dig Min)  31.8C  Wrist    3.0 <4.2 7.9 >3 B Elbow Wrist 4.0 23.0 58 >53  B Elbow    7.0  8.0  A Elbow B Elbow 0.9 11.0 122 >53  A Elbow    7.9  8.0          EMG   Side Muscle Nerve Root Ins Act Fibs Psw Amp Dur Poly Recrt Int  Pat Comment  Right Abd Poll Brev Median C8-T1 Nml Nml Nml Nml Nml 0 Nml Nml   Right 1stDorInt Ulnar C8-T1 Nml Nml Nml Nml Nml 0 Nml Nml   Right PronatorTeres Median C6-7 Nml Nml Nml Nml Nml 0 Nml Nml   Right Biceps Musculocut C5-6 Nml Nml Nml Nml Nml 0 Nml Nml   Right Deltoid Axillary C5-6 Nml Nml Nml Nml Nml 0 Nml Nml   Left 1stDorInt Ulnar C8-T1 Nml Nml Nml Nml Nml 0 Nml Nml   Left Abd Poll Brev Median C8-T1 Nml Nml Nml Nml Nml 0 Nml Nml     Nerve Conduction Studies Anti Sensory Left/Right Comparison   Stim Site L Lat (ms) R Lat (ms) L-R Lat (ms) L Amp (V) R Amp (V) L-R Amp (%) Site1 Site2 L Vel (m/s) R Vel (m/s) L-R Vel (m/s)  Median Acr Palm Anti Sensory (2nd Digit)  32.4C  Wrist *5.4 *5.9 0.5 13.1 11.2 14.5 Wrist Palm     Palm  *4.6   0.1        Radial Anti Sensory (Base 1st Digit)  31.2C  Wrist  1.9   20.0  Wrist Base 1st Digit     Ulnar Anti Sensory (5th Digit)  32C  Wrist  3.4   *13.1  Wrist 5th Digit   41    Motor Left/Right Comparison   Stim Site L Lat (ms) R Lat (ms) L-R Lat (ms) L Amp (mV) R Amp (mV) L-R Amp (%) Site1 Site2 L Vel (m/s) R Vel (m/s) L-R Vel (m/s)  Median Motor (Abd Poll Brev)  32.4C  Wrist *5.1 *6.4 *1.3 6.3 5.0 20.6 Elbow Wrist *48 *46 2  Elbow 10.1 11.4 1.3 6.2 4.8 22.6       Ulnar Motor (Abd Dig Min)  31.8C  Wrist  3.0   7.9  B Elbow Wrist  58   B Elbow  7.0   8.0  A Elbow B Elbow  122   A Elbow  7.9   8.0           Waveforms:

## 2023-03-23 ENCOUNTER — Encounter: Payer: Self-pay | Admitting: Physical Medicine and Rehabilitation

## 2023-03-23 NOTE — Progress Notes (Signed)
Dionne Ano Belarus - 42 y.o. male MRN [id number]  Date of birth: Jun 17, 1981  Office Visit Note: Visit Date: 03/21/2023 PCP: Toma Deiters, MD Referred by: Eldred Manges, MD  Subjective: Chief Complaint  Patient presents with   Right Hand - Numbness, Pain   Right Elbow - Pain   HPI:  Jase M Belarus is a 42 y.o. male who comes in today at the request of Dr. Annell Greening for evaluation and management of chronic, worsening and severe pain, numbness and tingling in the Bilateral upper extremities.  Patient is Right hand dominant.  He endorses chronic pain but more recent worsening of pain numbness and tingling and weakness right much more than left.  Some referral up the arm and the elbow and forearms bilaterally again more right than left.  He also endorses neck pain and cervicalgia with some pain into the shoulders.  Does not really endorse pain shooting down the arm.  He has a complicated history of gout and heart disease.  No history of diabetes.  He has felt weakness at times in the hands with dropping objects.  He does get nocturnal complaints with numbness and tingling with positive flick sign.  He has tried medications and splinting without relief.   I spent more than 30 minutes speaking face-to-face with the patient with 50% of the time in counseling and discussing coordination of care.    Review of Systems  Musculoskeletal:  Positive for joint pain and neck pain.  Neurological:  Positive for tingling and weakness.  All other systems reviewed and are negative.  Otherwise per HPI.  Assessment & Plan: Visit Diagnoses:    ICD-10-CM   1. Paresthesia of skin  R20.2 NCV with EMG (electromyography)    2. Pain in right hand  M79.641     3. Cervicalgia  M54.2     4. Weakness  R53.1       Plan: Impression: The above electrodiagnostic study is ABNORMAL and reveals evidence of:  a moderate to severe right median nerve entrapment at the wrist (carpal tunnel syndrome) affecting sensory  and motor components.   a moderate left median nerve entrapment at the wrist (carpal tunnel syndrome) affecting sensory and motor components.  There is no significant electrodiagnostic evidence of any other focal nerve entrapment, brachial plexopathy or cervical radiculopathy.  As you know, this particular electrodiagnostic study cannot rule out chemical radiculitis or sensory only radiculopathy.  Recommendations: 1.  Follow-up with referring physician. 2.  Continue current management of symptoms. 3.  Suggest surgical evaluation.  Meds & Orders: No orders of the defined types were placed in this encounter.   Orders Placed This Encounter  Procedures   NCV with EMG (electromyography)    Follow-up: Return for Annell Greening, MD.   Procedures: No procedures performed  EMG & NCV Findings: Evaluation of the left median motor and the right median motor nerves showed prolonged distal onset latency (L5.1, R6.4 ms) and decreased conduction velocity (Elbow-Wrist, L48, R46 m/s).  The left median (across palm) sensory nerve showed no response (Palm) and prolonged distal peak latency (5.4 ms).  The right median (across palm) sensory nerve showed prolonged distal peak latency (Wrist, 5.9 ms) and prolonged distal peak latency (Palm, 4.6 ms).  The right ulnar sensory nerve showed reduced amplitude (13.1 V).  All remaining nerves (as indicated in the following tables) were within normal limits.  Left vs. Right side comparison data for the median motor nerve indicates abnormal L-R latency difference (1.3  ms).    All examined muscles (as indicated in the following table) showed no evidence of electrical instability.    Impression: The above electrodiagnostic study is ABNORMAL and reveals evidence of:  a moderate to severe right median nerve entrapment at the wrist (carpal tunnel syndrome) affecting sensory and motor components.   a moderate left median nerve entrapment at the wrist (carpal tunnel syndrome)  affecting sensory and motor components.  There is no significant electrodiagnostic evidence of any other focal nerve entrapment, brachial plexopathy or cervical radiculopathy.  As you know, this particular electrodiagnostic study cannot rule out chemical radiculitis or sensory only radiculopathy.  Recommendations: 1.  Follow-up with referring physician. 2.  Continue current management of symptoms. 3.  Suggest surgical evaluation.  ___________________________ Naaman Plummer FAAPMR Board Certified, American Board of Physical Medicine and Rehabilitation    Nerve Conduction Studies Anti Sensory Summary Table   Stim Site NR Peak (ms) Norm Peak (ms) P-T Amp (V) Norm P-T Amp Site1 Site2 Delta-P (ms) Dist (cm) Vel (m/s) Norm Vel (m/s)  Left Median Acr Palm Anti Sensory (2nd Digit)  32.4C  Wrist    *5.4 <3.6 13.1 >10 Wrist Palm  0.0    Palm *NR  <2.0          Right Median Acr Palm Anti Sensory (2nd Digit)  31.8C  Wrist    *5.9 <3.6 11.2 >10 Wrist Palm 1.3 0.0    Palm    *4.6 <2.0 0.1         Right Radial Anti Sensory (Base 1st Digit)  31.2C  Wrist    1.9 <3.1 20.0  Wrist Base 1st Digit 1.9 0.0    Right Ulnar Anti Sensory (5th Digit)  32C  Wrist    3.4 <3.7 *13.1 >15.0 Wrist 5th Digit 3.4 14.0 41 >38   Motor Summary Table   Stim Site NR Onset (ms) Norm Onset (ms) O-P Amp (mV) Norm O-P Amp Site1 Site2 Delta-0 (ms) Dist (cm) Vel (m/s) Norm Vel (m/s)  Left Median Motor (Abd Poll Brev)  32.4C  Wrist    *5.1 <4.2 6.3 >5 Elbow Wrist 5.0 24.0 *48 >50  Elbow    10.1  6.2         Right Median Motor (Abd Poll Brev)  31.7C  Wrist    *6.4 <4.2 5.0 >5 Elbow Wrist 5.0 23.0 *46 >50  Elbow    11.4  4.8         Right Ulnar Motor (Abd Dig Min)  31.8C  Wrist    3.0 <4.2 7.9 >3 B Elbow Wrist 4.0 23.0 58 >53  B Elbow    7.0  8.0  A Elbow B Elbow 0.9 11.0 122 >53  A Elbow    7.9  8.0          EMG   Side Muscle Nerve Root Ins Act Fibs Psw Amp Dur Poly Recrt Int Dennie Bible Comment  Right Abd Poll Brev  Median C8-T1 Nml Nml Nml Nml Nml 0 Nml Nml   Right 1stDorInt Ulnar C8-T1 Nml Nml Nml Nml Nml 0 Nml Nml   Right PronatorTeres Median C6-7 Nml Nml Nml Nml Nml 0 Nml Nml   Right Biceps Musculocut C5-6 Nml Nml Nml Nml Nml 0 Nml Nml   Right Deltoid Axillary C5-6 Nml Nml Nml Nml Nml 0 Nml Nml   Left 1stDorInt Ulnar C8-T1 Nml Nml Nml Nml Nml 0 Nml Nml   Left Abd Poll Brev Median C8-T1 Nml Nml Nml Nml Nml 0  Nml Nml     Nerve Conduction Studies Anti Sensory Left/Right Comparison   Stim Site L Lat (ms) R Lat (ms) L-R Lat (ms) L Amp (V) R Amp (V) L-R Amp (%) Site1 Site2 L Vel (m/s) R Vel (m/s) L-R Vel (m/s)  Median Acr Palm Anti Sensory (2nd Digit)  32.4C  Wrist *5.4 *5.9 0.5 13.1 11.2 14.5 Wrist Palm     Palm  *4.6   0.1        Radial Anti Sensory (Base 1st Digit)  31.2C  Wrist  1.9   20.0  Wrist Base 1st Digit     Ulnar Anti Sensory (5th Digit)  32C  Wrist  3.4   *13.1  Wrist 5th Digit  41    Motor Left/Right Comparison   Stim Site L Lat (ms) R Lat (ms) L-R Lat (ms) L Amp (mV) R Amp (mV) L-R Amp (%) Site1 Site2 L Vel (m/s) R Vel (m/s) L-R Vel (m/s)  Median Motor (Abd Poll Brev)  32.4C  Wrist *5.1 *6.4 *1.3 6.3 5.0 20.6 Elbow Wrist *48 *46 2  Elbow 10.1 11.4 1.3 6.2 4.8 22.6       Ulnar Motor (Abd Dig Min)  31.8C  Wrist  3.0   7.9  B Elbow Wrist  58   B Elbow  7.0   8.0  A Elbow B Elbow  122   A Elbow  7.9   8.0           Waveforms:                 Clinical History: No specialty comments available.     Objective:  VS:  HT:    WT:   BMI:     BP:   HR: bpm  TEMP: ( )  RESP:  Physical Exam Vitals and nursing note reviewed.  Constitutional:      General: He is not in acute distress.    Appearance: Normal appearance. He is well-developed. He is obese.  HENT:     Head: Normocephalic and atraumatic.  Eyes:     Conjunctiva/sclera: Conjunctivae normal.     Pupils: Pupils are equal, round, and reactive to light.  Cardiovascular:     Rate and Rhythm: Normal rate.      Pulses: Normal pulses.     Heart sounds: Normal heart sounds.  Pulmonary:     Effort: Pulmonary effort is normal. No respiratory distress.  Musculoskeletal:        General: No tenderness.     Cervical back: Normal range of motion and neck supple. No rigidity.     Right lower leg: No edema.     Left lower leg: No edema.     Comments: Inspection reveals no atrophy of the bilateral APB or FDI or hand intrinsics. There is no swelling, color changes, allodynia or dystrophic changes. There is 5 out of 5 strength in the bilateral wrist extension, finger abduction and long finger flexion. There is intact sensation to light touch in all dermatomal and peripheral nerve distributions.  There is a negative Tinel's test at the bilateral wrist and elbow. There is a positive Phalen's test bilaterally. There is a negative Hoffmann's test bilaterally.  Skin:    General: Skin is warm and dry.     Findings: No erythema or rash.  Neurological:     General: No focal deficit present.     Mental Status: He is alert and oriented to person, place, and time.  Sensory: No sensory deficit.     Motor: No weakness or abnormal muscle tone.     Coordination: Coordination normal.     Gait: Gait normal.  Psychiatric:        Mood and Affect: Mood normal.        Behavior: Behavior normal.        Thought Content: Thought content normal.      Imaging: No results found.

## 2023-03-27 ENCOUNTER — Encounter: Payer: Self-pay | Admitting: Medical

## 2023-03-27 ENCOUNTER — Ambulatory Visit: Payer: Medicaid Other | Attending: Medical | Admitting: Medical

## 2023-03-27 VITALS — BP 134/86 | HR 89 | Ht 72.0 in | Wt 313.0 lb

## 2023-03-27 DIAGNOSIS — R29818 Other symptoms and signs involving the nervous system: Secondary | ICD-10-CM | POA: Diagnosis not present

## 2023-03-27 DIAGNOSIS — E782 Mixed hyperlipidemia: Secondary | ICD-10-CM | POA: Diagnosis not present

## 2023-03-27 DIAGNOSIS — Z720 Tobacco use: Secondary | ICD-10-CM

## 2023-03-27 DIAGNOSIS — I201 Angina pectoris with documented spasm: Secondary | ICD-10-CM | POA: Diagnosis not present

## 2023-03-27 DIAGNOSIS — I10 Essential (primary) hypertension: Secondary | ICD-10-CM

## 2023-03-27 NOTE — Progress Notes (Addendum)
Cardiology Office Note:    Date:  03/27/2023   ID:  Brian Deleon, [name] 1981-01-20, MRN [id number]  PCP:  Brian Deiters, MD  [facility] Cardiologist:  Dina Rich, MD  [facility] Electrophysiologist:  None   Referring MD: Brian Deiters, MD   Chief Complaint: 1 year follow-up   History of Present Illness:    Brian Deleon is a 42 y.o. male with a hx of coronary vasospasm, tobacco use, HLD, and OSA who presents for 1 year follow-up.   The patient has a history of CAD with NSTEMI in 2013. Cath showed no CAD, felt to be vasospasm. Stress test in 06/2021 showed no ischemia.   Last seen 01/2022 and was stable from a cardiac perspective.   Today, the patient reports he has been doing overall well from a cardiac perspective. He has occasional chest discomfort. The discomfort may occur a couple times a month. He denies shortness of breath, lower leg edema, orthopnea, pnd, lightheadedness or dizziness. He smokes 1-2 ppd. He does not need refills of meds. Need blood work from PCP.   Past Medical History:  Diagnosis Date   GERD (gastroesophageal reflux disease)    Gout    Hyperlipidemia    Hypertension    Non-ST elevation MI (NSTEMI) (HCC)    Question coronary vasospasm with angiographically NL cors; EF 55-65%, question tiny inferobasal HK, cardiac catheterization, 10/2012   Tobacco abuse     Past Surgical History:  Procedure Laterality Date   CARDIAC CATHETERIZATION  10/22/2012   angiographically normal coronaries; LVEF 55-65%; questionable inferobasal HK   EXTRACORPOREAL SHOCK WAVE LITHOTRIPSY Right 07/26/2022   Procedure: EXTRACORPOREAL SHOCK WAVE LITHOTRIPSY (ESWL);  Surgeon: Malen Gauze, MD;  Location: [facility];  Service: Urology;  Laterality: Right;   LEFT HEART CATHETERIZATION WITH CORONARY ANGIOGRAM N/A 10/22/2012   Procedure: LEFT HEART CATHETERIZATION WITH CORONARY ANGIOGRAM;  Surgeon: Herby Abraham, MD;  [facility] CATH LAB;  Service: Cardiovascular;   Laterality: N/A;    Current Medications: Current Meds  Medication Sig   alfuzosin (UROXATRAL) 10 MG 24 hr tablet TAKE 1 TABLET BY MOUTH EVERYDAY AT BEDTIME   allopurinol (ZYLOPRIM) 300 MG tablet Take 300 mg by mouth daily.   amLODipine (NORVASC) 5 MG tablet Take 5 mg by mouth daily.   aspirin EC 81 MG EC tablet Take 1 tablet (81 mg total) by mouth daily.   colchicine 0.6 MG tablet Take 0.6 mg by mouth daily.   levocetirizine (XYZAL) 5 MG tablet Take 5 mg by mouth every evening.   nitroGLYCERIN (NITROSTAT) 0.4 MG SL tablet Place 1 tablet (0.4 mg total) under the tongue every 5 (five) minutes as needed for chest pain.   Omega-3 Fatty Acids (FISH OIL) 1000 MG CAPS Take by mouth in the morning, at noon, and at bedtime.   omeprazole (PRILOSEC) 20 MG capsule Take 1 capsule (20 mg total) by mouth daily.   ondansetron (ZOFRAN) 4 MG tablet Take 1 tablet (4 mg total) by mouth every 8 (eight) hours as needed for nausea or vomiting.   oxyCODONE-acetaminophen (PERCOCET) 5-325 MG tablet Take 1 tablet by mouth every 4 (four) hours as needed.     Allergies:   Elemental sulfur and Simvastatin   Social History   Socioeconomic History   Marital status: Single    Spouse name: Not on file   Number of children: Not on file   Years of education: Not on file   Highest education level: Not on file  Occupational History   Occupation: Curator  Tobacco Use   Smoking status: Every Day    Packs/day: 0.50    Years: 15.00    Additional pack years: 0.00    Total pack years: 7.50    Types: Cigarettes    Start date: 06/12/1998   Smokeless tobacco: Never  Vaping Use   Vaping Use: Never used  Substance and Sexual Activity   Alcohol use: No    Alcohol/week: 0.0 standard drinks of alcohol   Drug use: No   Sexual activity: Yes  Other Topics Concern   Not on file  Social History Narrative   Works as a Nurse, learning disability of Corporate investment banker Strain: Not on Ship broker Insecurity:  Not on file  Transportation Needs: Not on file  Physical Activity: Not on file  Stress: Not on file  Social Connections: Not on file     Family History: The patient's family history includes Heart attack in his father; Hypertension in his father.  ROS:   Please see the history of present illness.     All other systems reviewed and are negative.  EKGs/Labs/Other Studies Reviewed:    The following studies were reviewed today:  Stress test 06/2021 Narrative & Impression      Findings are consistent with no significant ischemia. The study is low risk.   No ST deviation was noted.   There is no evidence of inducible ischemia and infarction.   Defect 1: There is a small defect with mild reduction in uptake present in the apical anteroseptal location(s) that is fixed. Defect 2: There is a small defect with mild reduction in uptake present in the basal inferolateral location(s) that is partially reversible in the setting of gut uptake.   Nuclear stress EF: 53 %. The left ventricular ejection fraction is normal (55-65%). Left ventricular function is normal. End diastolic cavity size is normal. End systolic cavity size is normal.   Low risk with evidence of variable soft tissue attenuation, no significant ischemia, LVEF 53%.      Echo 2013 Left ventricle: The cavity size was mildly dilated. Wall  thickness was normal. Systolic function was normal. The  estimated ejection fraction was in the range of 55% to 60%.              Transthoracic echocardiography.  M-mode,  complete 2D, spectral Doppler, and color Doppler.  Height:  Height: 182.9cm. Height: 72in.  Weight:  Weight: 121.8kg.  Weight: 268lb.  Body mass index:  BMI: 36.4kg/m^2.  Body  surface area:    BSA: 2.15m^2.  Blood pressure:     129/76.  Patient status:  Inpatient.  Location:  Echo laboratory.    EKG:  EKG is ordered today.  The ekg ordered today demonstrates NSR, 89bpm, Rad, non specific ST changes  Recent Labs: No  results found for requested labs within last 365 days.  Recent Lipid Panel    Component Value Date/Time   CHOL 177 10/22/2012 0440   TRIG 154 (H) 10/22/2012 0440   HDL 28 (L) 10/22/2012 0440   CHOLHDL 6.3 10/22/2012 0440   VLDL 31 10/22/2012 0440   LDLCALC 118 (H) 10/22/2012 0440    Physical Exam:    VS:  BP 134/86   Pulse 89   Ht 6' (1.829 m)   Wt (!) 313 lb (142 kg)   SpO2 95%   BMI 42.45 kg/m     Wt Readings from Last 3 Encounters:  03/27/23 Marland Kitchen)  313 lb (142 kg)  03/16/23 (!) 315 lb (142.9 kg)  07/26/22 296 lb (134.3 kg)     GEN:  Well nourished, well developed in no acute distress HEENT: Normal NECK: No JVD; No carotid bruits LYMPHATICS: No lymphadenopathy CARDIAC: RRR, no murmurs, rubs, gallops RESPIRATORY:  Clear to auscultation without rales, wheezing or rhonchi  ABDOMEN: Soft, non-tender, non-distended MUSCULOSKELETAL:  No edema; No deformity  SKIN: Warm and dry NEUROLOGIC:  Alert and oriented x 3 PSYCHIATRIC:  Normal affect   ASSESSMENT:    1. Coronary artery spasm (HCC)   2. Mixed hyperlipidemia   3. Essential hypertension   4. Suspected sleep apnea   5. Tobacco abuse    PLAN:    In order of problems listed above:  Coronary Vasospasm He reports occasional chest discomfort. He has not needed NTG. Continue Aspirin, amlodipine and  NTG. Can increase amlodipine in the future if episodes worsen.  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 We will order a Myoview lexiscan stress test for DOT clearance and occasional chest discomfort.   HTN BP is reasonable today, continue amlodipine 5mg  daily.   OSA The patient didn't complete sleep study last year, he is in transition right now and would like to wait on re-referral to pulmonology. I recommended he follow-up with this.   Tobacco use 1-2 ppd. Complete cessation recommended.   Dyslipidemia LDL 138 in 2023. Will request labs from PCP. He is not on cholesterol lowering  medications.   Disposition: Follow up in 1 year(s) with MD/APP    Signed, Olanda Boughner David Stall, PA-C  03/27/2023 3:35 PM     [facility]

## 2023-03-27 NOTE — Patient Instructions (Signed)
Medication Instructions:  Your physician recommends that you continue on your current medications as directed. Please refer to the Current Medication list given to you today.   Labwork: None today  Testing/Procedures: None today  Follow-Up: 1 year  Any Other Special Instructions Will Be Listed Below (If Applicable).  If you need a refill on your cardiac medications before your next appointment, please call your pharmacy.  

## 2023-03-28 ENCOUNTER — Encounter: Payer: Self-pay | Admitting: Internal Medicine

## 2023-03-30 ENCOUNTER — Ambulatory Visit (INDEPENDENT_AMBULATORY_CARE_PROVIDER_SITE_OTHER): Payer: Medicaid Other | Admitting: Orthopaedic Surgery

## 2023-03-30 ENCOUNTER — Encounter: Payer: Self-pay | Admitting: Orthopaedic Surgery

## 2023-03-30 VITALS — Ht 72.0 in | Wt 313.0 lb

## 2023-03-30 DIAGNOSIS — G5601 Carpal tunnel syndrome, right upper limb: Secondary | ICD-10-CM | POA: Diagnosis not present

## 2023-03-30 NOTE — Progress Notes (Signed)
Office Visit Note   Patient: Brian Deleon           Date of Birth: [birth year]           MRN: [id number] Visit Date: 03/30/2023              Requested by: Toma Deiters, MD [address] [place],  [place] [address] PCP: Toma Deiters, MD   Assessment & Plan: Visit Diagnoses:  1. Carpal tunnel syndrome, right upper limb     Plan: Patient like to proceed with right carpal tunnel release.  This to be an outpatient procedure we discussed sutures retention x 2 weeks.  Activity postop discussed in detail.  We discussed MAC anesthesia with IV sedation and local for the procedure.  Will get cardiac clearance.  Follow-Up Instructions: No follow-ups on file.   Orders:  No orders of the defined types were placed in this encounter.  No orders of the defined types were placed in this encounter.     Procedures: No procedures performed   Clinical Data: No additional findings.   Subjective: Chief Complaint  Patient presents with   Right Hand - Follow-up    EMG/NCV review   Left Hand - Follow-up    EMG/NCV review    HPI 42 year old male returns post electrical test for persistent symptoms right hand numbness wakes him up at night failed splinting.  Electrical test shows moderate to severe right carpal tunnel and moderate left carpal tunnel syndrome.  He is more symptomatic in his right hand than the left and wants to proceed with surgical tunnel release.  Review of Systems history of coronary spasms GERD headache.  Negative for CVA.  Past history of non-STEMI.  He has seen Dr. Wyline Mood in the past.  Negative current chest pain no shortness of breath.   Objective: Vital Signs: Ht 6' (1.829 m)   Wt (!) 313 lb (142 kg)   BMI 42.45 kg/m   Physical Exam Constitutional:      Appearance: He is well-developed.  HENT:     Head: Normocephalic and atraumatic.     Right Ear: External ear normal.     Left Ear: External ear normal.  Eyes:     Pupils: Pupils are equal, round, and  reactive to light.  Neck:     Thyroid: No thyromegaly.     Trachea: No tracheal deviation.  Cardiovascular:     Rate and Rhythm: Normal rate.  Pulmonary:     Effort: Pulmonary effort is normal.     Breath sounds: No wheezing.  Abdominal:     General: Bowel sounds are normal.     Palpations: Abdomen is soft.  Musculoskeletal:     Cervical back: Neck supple.  Skin:    General: Skin is warm and dry.     Capillary Refill: Capillary refill takes less than 2 seconds.  Neurological:     Mental Status: He is alert and oriented to person, place, and time.  Psychiatric:        Behavior: Behavior normal.        Thought Content: Thought content normal.        Judgment: Judgment normal.     Ortho Exam slight thenar atrophy on the right positive carpal compression test positive Phalen's test negative brachial plexus tenderness negative Spurling good cervical range of motion.  Specialty Comments:  No specialty comments available.  Imaging: No results found.   PMFS History: Patient Active Problem List   Diagnosis  Date Noted   Carpal tunnel syndrome, right upper limb 03/30/2023   Bilateral hand numbness 03/16/2023   OSA (obstructive sleep apnea) 04/01/2022   Coronary artery spasm (HCC) 02/26/2014   NSTEMI (non-ST elevated myocardial infarction) (HCC) 10/23/2012   Hyperlipidemia 10/23/2012   GERD (gastroesophageal reflux disease) 10/23/2012   Tobacco abuse 10/23/2012   Malaise and fatigue 10/23/2012   Nausea 10/21/2012   Headache(784.0) 10/21/2012   Past Medical History:  Diagnosis Date   GERD (gastroesophageal reflux disease)    Gout    Hyperlipidemia    Hypertension    Non-ST elevation MI (NSTEMI) (HCC)    Question coronary vasospasm with angiographically NL cors; EF 55-65%, question tiny inferobasal HK, cardiac catheterization, 10/2012   Tobacco abuse     Family History  Problem Relation Age of Onset   Heart attack Father    Hypertension Father     Past Surgical  History:  Procedure Laterality Date   CARDIAC CATHETERIZATION  10/22/2012   angiographically normal coronaries; LVEF 55-65%; questionable inferobasal HK   EXTRACORPOREAL SHOCK WAVE LITHOTRIPSY Right 07/26/2022   Procedure: EXTRACORPOREAL SHOCK WAVE LITHOTRIPSY (ESWL);  Surgeon: Malen Gauze, MD;  Location: [facility];  Service: Urology;  Laterality: Right;   LEFT HEART CATHETERIZATION WITH CORONARY ANGIOGRAM N/A 10/22/2012   Procedure: LEFT HEART CATHETERIZATION WITH CORONARY ANGIOGRAM;  Surgeon: Herby Abraham, MD;  Location: [facility] CATH LAB;  Service: Cardiovascular;  Laterality: N/A;   Social History   Occupational History   Occupation: Curator  Tobacco Use   Smoking status: Every Day    Packs/day: 0.50    Years: 15.00    Additional pack years: 0.00    Total pack years: 7.50    Types: Cigarettes    Start date: 06/12/1998   Smokeless tobacco: Never  Vaping Use   Vaping Use: Never used  Substance and Sexual Activity   Alcohol use: No    Alcohol/week: 0.0 standard drinks of alcohol   Drug use: No   Sexual activity: Yes

## 2023-04-11 ENCOUNTER — Telehealth: Payer: Self-pay | Admitting: Cardiology

## 2023-04-11 NOTE — Telephone Encounter (Signed)
   Pre-operative Risk Assessment    Patient Name: Brian Deleon  DOB: December 07, 1980 MRN: [id number]{      Request for Surgical Clearance    Procedure:   RT CARPAL TUNNEL RELEASE  Date of Surgery:  Clearance TBD                                 Surgeon:  Annell Greening Surgeon's Group or Practice Name:  [facility] Phone number:  [phone] Fax number:  [phone] ATTN SHERRIE   Type of Clearance Requested:   - Medical    Type of Anesthesia:  MAC   Additional requests/questions:  Please fax a copy of CLEARANCE to the surgeon's office.  Signed, Vallarie Mare   04/11/2023, 11:09 AM  \

## 2023-04-11 NOTE — Telephone Encounter (Signed)
   Name: Brian Deleon  DOB: 29-Aug-1981  MRN: [id number]   Primary Cardiologist: Dina Rich, MD  Chart reviewed as part of pre-operative protocol coverage. Atwell Russello Deleon was last seen on 03/27/2023 by Cadence Furth, PA-C.  He complained of occasional chest discomfort but had not needed to take NTG.  No further ischemic workup was indicated.  Therefore, based on ACC/AHA guidelines, the patient would be at acceptable risk for the planned procedure without further cardiovascular testing.   I will route this recommendation to the requesting party via Epic fax function and remove from pre-op pool. Please call with questions.  Carlos Levering, NP 04/11/2023, 4:19 PM

## 2023-04-18 ENCOUNTER — Telehealth: Payer: Self-pay

## 2023-04-18 NOTE — Telephone Encounter (Signed)
Received cardiac clearance and I called patient to discuss scheduling right CTR.  He stated he is in process of job interview with [employer].  Anticipating that he gets job, he is going to have to wait on surgery.  He will call us back when he is ready to schedule.

## 2023-04-19 ENCOUNTER — Telehealth: Payer: Self-pay | Admitting: Cardiology

## 2023-04-19 DIAGNOSIS — I201 Angina pectoris with documented spasm: Secondary | ICD-10-CM

## 2023-04-19 DIAGNOSIS — Z0289 Encounter for other administrative examinations: Secondary | ICD-10-CM

## 2023-04-19 NOTE — Telephone Encounter (Signed)
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   Had OV in [place] with C. Furth on 03/27/23  Please give pt a call @ [phone]

## 2023-04-19 NOTE — Telephone Encounter (Signed)
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

## 2023-04-20 NOTE — Telephone Encounter (Signed)
Patient notified and verbalized understanding.  States that he had stress test before & was not sure if he could use that one.  Last stress test was 06/28/2021.  He will call occupational health back to see if they can use that one or if needs to be more current.

## 2023-04-20 NOTE — Telephone Encounter (Signed)
Will route to provider for approval.

## 2023-04-20 NOTE — Telephone Encounter (Signed)
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

## 2023-04-21 ENCOUNTER — Other Ambulatory Visit: Payer: Self-pay | Admitting: *Deleted

## 2023-04-21 NOTE — Telephone Encounter (Signed)
Patient calling back stating that he could not get in touch with occupational healt so he would like to go ahead and schedule the stress test.  He is requesting to do the Lexi like he did in 2022 instead of the GXT.  Will send to provider for approval.

## 2023-04-24 NOTE — Telephone Encounter (Signed)
Patient notified and verbalized understanding.  Order entered for [facility] & patient instructions given.

## 2023-04-24 NOTE — Telephone Encounter (Signed)
Ok to order lexiscan for coronary vasospasm. His insurance may make [place] document why he cannot run on treadmill, would go ahead and clarify with him that point  Dominga Ferry MD

## 2023-04-25 ENCOUNTER — Encounter: Payer: Self-pay | Admitting: *Deleted

## 2023-04-25 ENCOUNTER — Telehealth: Payer: Self-pay | Admitting: Cardiology

## 2023-04-25 NOTE — Telephone Encounter (Signed)
Checking percert on the following patient for testing scheduled at [facility].   Lexiscan   04/28/2023

## 2023-04-27 ENCOUNTER — Telehealth: Payer: Self-pay | Admitting: Cardiology

## 2023-04-27 NOTE — Telephone Encounter (Signed)
Patient called to follow-up on the status of his test pre-authorization.

## 2023-04-27 NOTE — Telephone Encounter (Signed)
Patient's insurance is still pending -received message from [facility] department that we need to re-schedule. Called patient- unable to leave message.

## 2023-04-27 NOTE — Telephone Encounter (Signed)
Noted  

## 2023-04-28 ENCOUNTER — Encounter (HOSPITAL_COMMUNITY): Payer: Medicaid Other

## 2023-05-01 NOTE — Telephone Encounter (Signed)
Patient is calling to get update on insurance claim and scheduling. Please advise.

## 2023-06-14 ENCOUNTER — Telehealth: Payer: Self-pay | Admitting: Cardiology

## 2023-06-14 NOTE — Telephone Encounter (Signed)
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 He has applied for new insurance. As soon as he receives the information he will contact office so that we may get his test re-scheduled.

## 2023-06-14 NOTE — Telephone Encounter (Signed)
Noted thank you

## 2023-07-18 ENCOUNTER — Telehealth: Payer: Self-pay | Admitting: Cardiology

## 2023-07-18 NOTE — Telephone Encounter (Signed)
Pt called in stating he got new insurance and he would like a new auth to be put through for his stress test   He states he has [employer] insurance now

## 2023-07-24 ENCOUNTER — Telehealth: Payer: Self-pay | Admitting: Cardiology

## 2023-07-24 NOTE — Telephone Encounter (Signed)
Checking percert on the following patient for testing scheduled at [facility].    LEXISCAN - 07/28/2023   PATIENT HAS NEW INSURANCE

## 2023-07-28 ENCOUNTER — Encounter (HOSPITAL_COMMUNITY): Payer: Medicaid Other

## 2023-08-02 ENCOUNTER — Telehealth: Payer: Self-pay | Admitting: Cardiology

## 2023-08-02 NOTE — Telephone Encounter (Signed)
Reports he is asymptomatic and has tested negative for covid this morning. Advised that he can keep stress test appointment scheduled for 08/04/2023 and if he develops any respiratory symptoms, or test covid positive, to contact office to reschedule stress test. Advised he should wear a mask as well for precautions. Verbalized understanding.

## 2023-08-02 NOTE — Telephone Encounter (Signed)
New Message:     Patient is scheduled for a Stress Test on Friday(08-04-23). Patient says his wife has Covid. He says he has tested negative. He wanst to know if he needs to reschedule his test?

## 2023-08-03 ENCOUNTER — Telehealth (HOSPITAL_COMMUNITY): Payer: Self-pay | Admitting: Surgery

## 2023-08-04 ENCOUNTER — Encounter (HOSPITAL_COMMUNITY): Payer: Medicaid Other

## 2023-08-08 ENCOUNTER — Telehealth (HOSPITAL_COMMUNITY): Payer: Self-pay | Admitting: Emergency Medicine

## 2023-08-08 NOTE — Telephone Encounter (Signed)
Pt understands and will arrival at 10:15 on 08/09/2023.

## 2023-08-09 ENCOUNTER — Ambulatory Visit (HOSPITAL_COMMUNITY)
Admission: RE | Admit: 2023-08-09 | Discharge: 2023-08-09 | Disposition: A | Discharge status: Home / Self Care | Payer: Medicaid Other | Source: Ambulatory Visit | Attending: Cardiology | Admitting: Cardiology

## 2023-08-09 ENCOUNTER — Ambulatory Visit (HOSPITAL_COMMUNITY)
Admission: RE | Admit: 2023-08-09 | Discharge: 2023-08-09 | Disposition: A | Discharge status: Home / Self Care | Payer: Medicaid Other | Source: Ambulatory Visit | Attending: Cardiology

## 2023-08-09 DIAGNOSIS — I201 Angina pectoris with documented spasm: Secondary | ICD-10-CM | POA: Insufficient documentation

## 2023-08-09 DIAGNOSIS — Z0289 Encounter for other administrative examinations: Secondary | ICD-10-CM | POA: Diagnosis present

## 2023-08-09 LAB — NM MYOCAR MULTI W/SPECT W/WALL MOTION / EF
LV dias vol: 129 mL (ref 62–150)
LV sys vol: 47 mL
Nuc Stress EF: 63 %
Peak HR: 102 {beats}/min
RATE: 0.4
Rest HR: 74 {beats}/min
Rest Nuclear Isotope Dose: 10 mCi
SDS: 0
SRS: 3
SSS: 3
ST Depression (mm): 0 mm
Stress Nuclear Isotope Dose: 31 mCi
TID: 1.13

## 2023-08-09 MED ORDER — REGADENOSON 0.4 MG/5ML IV SOLN
INTRAVENOUS | Status: AC
  Administered 2023-08-09: 0.4 mg via INTRAVENOUS
  Filled 2023-08-09: qty 5

## 2023-08-09 MED ORDER — TECHNETIUM TC 99M TETROFOSMIN IV KIT
10.0000 | PACK | Freq: Once | INTRAVENOUS | Status: AC | PRN
  Administered 2023-08-09: 10 via INTRAVENOUS

## 2023-08-09 MED ORDER — SODIUM CHLORIDE FLUSH 0.9 % IV SOLN
INTRAVENOUS | Status: AC
  Administered 2023-08-09: 10 mL via INTRAVENOUS
  Filled 2023-08-09: qty 10

## 2023-08-09 MED ORDER — TECHNETIUM TC 99M TETROFOSMIN IV KIT
30.0000 | PACK | Freq: Once | INTRAVENOUS | Status: AC | PRN
  Administered 2023-08-09: 31 via INTRAVENOUS

## 2023-08-11 ENCOUNTER — Telehealth: Payer: Self-pay | Admitting: Cardiology

## 2023-08-11 NOTE — Telephone Encounter (Signed)
Patient informed results are on Dr. Wyline Mood desk top awaiting his final review.  Patient states he needs this before 08/17/23 if possible.

## 2023-08-11 NOTE — Telephone Encounter (Signed)
Patient called to check if his stress test paperwork is ready.  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

## 2023-08-15 ENCOUNTER — Telehealth: Payer: Self-pay | Admitting: *Deleted

## 2023-08-15 ENCOUNTER — Encounter: Payer: Self-pay | Admitting: *Deleted

## 2023-08-15 NOTE — Telephone Encounter (Signed)
-----   Message from Dina Rich sent at 08/14/2023  2:29 PM EDT ----- Normal stress test, no evidence of any blockages. 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

## 2023-08-15 NOTE — Telephone Encounter (Signed)
Lesle Chris, LPN 07/15/1190  4:78 AM EDT Back to Top    Notified, copy to pcp.
